# Patient Record
Sex: Male | Born: 1955 | Race: Black or African American | Hispanic: No | Marital: Single | Smoking: Current every day smoker
Health system: Southern US, Community
[De-identification: ages and names within clinical notes are randomized; demographics above are authoritative.]

## PROBLEM LIST (undated history)

## (undated) DIAGNOSIS — B1920 Unspecified viral hepatitis C without hepatic coma: Secondary | ICD-10-CM

## (undated) DIAGNOSIS — F209 Schizophrenia, unspecified: Secondary | ICD-10-CM

## (undated) HISTORY — DX: Unspecified viral hepatitis C without hepatic coma: B19.20

---

## 2018-11-28 ENCOUNTER — Other Ambulatory Visit: Payer: Self-pay

## 2018-11-28 ENCOUNTER — Encounter: Payer: Self-pay | Admitting: Emergency Medicine

## 2018-11-28 ENCOUNTER — Emergency Department
Admission: EM | Admit: 2018-11-28 | Discharge: 2018-11-30 | Disposition: A | Discharge status: Other Acute Inpatient Hospital | Payer: Medicaid Other | Attending: Emergency Medicine | Admitting: Emergency Medicine

## 2018-11-28 DIAGNOSIS — F17200 Nicotine dependence, unspecified, uncomplicated: Secondary | ICD-10-CM | POA: Diagnosis not present

## 2018-11-28 DIAGNOSIS — F203 Undifferentiated schizophrenia: Secondary | ICD-10-CM

## 2018-11-28 DIAGNOSIS — R419 Unspecified symptoms and signs involving cognitive functions and awareness: Secondary | ICD-10-CM

## 2018-11-28 DIAGNOSIS — F209 Schizophrenia, unspecified: Secondary | ICD-10-CM | POA: Diagnosis not present

## 2018-11-28 DIAGNOSIS — Z79899 Other long term (current) drug therapy: Secondary | ICD-10-CM | POA: Diagnosis not present

## 2018-11-28 DIAGNOSIS — F29 Unspecified psychosis not due to a substance or known physiological condition: Secondary | ICD-10-CM | POA: Diagnosis present

## 2018-11-28 DIAGNOSIS — R4689 Other symptoms and signs involving appearance and behavior: Secondary | ICD-10-CM

## 2018-11-28 HISTORY — DX: Schizophrenia, unspecified: F20.9

## 2018-11-28 LAB — COMPREHENSIVE METABOLIC PANEL
ALT: 32 U/L (ref 0–44)
AST: 54 U/L — ABNORMAL HIGH (ref 15–41)
Albumin: 3.9 g/dL (ref 3.5–5.0)
Alkaline Phosphatase: 66 U/L (ref 38–126)
Anion gap: 8 (ref 5–15)
BUN: 19 mg/dL (ref 8–23)
CALCIUM: 9.3 mg/dL (ref 8.9–10.3)
CO2: 24 mmol/L (ref 22–32)
Chloride: 112 mmol/L — ABNORMAL HIGH (ref 98–111)
Creatinine, Ser: 0.87 mg/dL (ref 0.61–1.24)
GFR calc Af Amer: >60 mL/min (ref >60)
GFR calc non Af Amer: >60 mL/min (ref >60)
Glucose, Bld: 81 mg/dL (ref 70–99)
Potassium: 4 mmol/L (ref 3.5–5.1)
Sodium: 144 mmol/L (ref 135–145)
Total Bilirubin: 1.3 mg/dL — ABNORMAL HIGH (ref 0.3–1.2)
Total Protein: 8 g/dL (ref 6.5–8.1)

## 2018-11-28 LAB — CBC
HEMATOCRIT: 45.9 % (ref 39.0–52.0)
Hemoglobin: 15.2 g/dL (ref 13.0–17.0)
MCH: 32.1 pg (ref 26.0–34.0)
MCHC: 33.1 g/dL (ref 30.0–36.0)
MCV: 97 fL (ref 80.0–100.0)
PLATELETS: 248 10*3/uL (ref 150–400)
RBC: 4.73 MIL/uL (ref 4.22–5.81)
RDW: 11.8 % (ref 11.5–15.5)
WBC: 7.3 10*3/uL (ref 4.0–10.5)
nRBC: 0 % (ref 0.0–0.2)

## 2018-11-28 LAB — ETHANOL: Alcohol, Ethyl (B): <10 mg/dL (ref <10)

## 2018-11-28 NOTE — ED Triage Notes (Signed)
Pt arrives POV to triage with [facility] with c/o aggression towards staff members at [facility] in [place]. Pt is calm and cooperative at this time in triage and reports that he was upset because "they were messing with those children". Pt is in NAD.

## 2018-11-28 NOTE — ED Notes (Signed)
Pt has one pair of muddy nike shoes, one pair of polka dotted socks, one brown jacket, one blue sweater, one pair blue jeans, one green belt, and one pair plaid underwear. Pt also placed a comb and four dollars in bag.

## 2018-11-28 NOTE — ED Notes (Signed)
When asked about urine pt states that "I don't have to wet wet at this time".

## 2018-11-29 DIAGNOSIS — F209 Schizophrenia, unspecified: Secondary | ICD-10-CM

## 2018-11-29 DIAGNOSIS — R419 Unspecified symptoms and signs involving cognitive functions and awareness: Secondary | ICD-10-CM

## 2018-11-29 DIAGNOSIS — F203 Undifferentiated schizophrenia: Secondary | ICD-10-CM

## 2018-11-29 LAB — URINE DRUG SCREEN, QUALITATIVE (ARMC ONLY)
Amphetamines, Ur Screen: NOT DETECTED
BARBITURATES, UR SCREEN: NOT DETECTED
Benzodiazepine, Ur Scrn: NOT DETECTED
Cannabinoid 50 Ng, Ur ~~LOC~~: NOT DETECTED
Cocaine Metabolite,Ur ~~LOC~~: NOT DETECTED
MDMA (Ecstasy)Ur Screen: NOT DETECTED
METHADONE SCREEN, URINE: NOT DETECTED
Opiate, Ur Screen: NOT DETECTED
Phencyclidine (PCP) Ur S: NOT DETECTED
Tricyclic, Ur Screen: NOT DETECTED

## 2018-11-29 MED ORDER — LORAZEPAM 2 MG PO TABS
2.0000 mg | ORAL_TABLET | Freq: Once | ORAL | Status: AC
  Administered 2018-11-29: 2 mg via ORAL
  Filled 2018-11-29: qty 1

## 2018-11-29 NOTE — ED Notes (Signed)
IVC, pending placement per SOC 

## 2018-11-29 NOTE — ED Notes (Signed)
Pt sleeping. Lunch tray placed at bedside. 

## 2018-11-29 NOTE — BH Assessment (Signed)
Assessment Note  Lance Bautista is an 63 y.o. male. Patient presents to [facility]ED due to aggression towards [facility] in [place], [place]. Patient was a poor historian during assessment and states "I don't know why I'm here." Patient wasn't able to identify where he lives and if he has a legal guardian. Patient denies SI/HI/AVH.  Patient denies previous inpatient psychiatric hospitalizations and outpatient mental health providers.   Patient denies ETOH and illicit drug use.  Patient doesn't currently have any involvement in the legal system.  Patient presents oriented x 2, with a pleasant affect during assessment.    Diagnosis: Schizophrenia  Past Medical History:  Past Medical History:  Diagnosis Date  . Schizophrenia (HCC)     History reviewed. No pertinent surgical history.  Family History: No family history on file.  Social History:  reports that he has been smoking. He has never used smokeless tobacco. He reports previous alcohol use. He reports previous drug use.  Additional Social History:  Alcohol / Drug Use Pain Medications: SEE PTA  Prescriptions: SEE PTA  Over the Counter: SEE PTA  History of alcohol / drug use?: No history of alcohol / drug abuse Longest period of sobriety (when/how long): None reported   CIWA: CIWA-Ar BP: 140/83 Pulse Rate: 83 COWS:    Allergies: No Known Allergies  Home Medications: (Not in a hospital admission)   OB/GYN Status:  No LMP for male patient.  General Assessment Data Assessment unable to be completed: (Assessment completed) [facility] T[facility] Assessment: In system Is this a Tele or Face-to-Face Assessment?: Face-to-Face Is this an Initial Assessment or a Re-assessment for this encounter?: Initial Assessment Patient Accompanied by:: N/A Language Other than English: No Living Arrangements: In Group Home: (Comment: Name of Group Home)([facility] ([place], [place]) ) What gender do you  identify as?: Male Marital status: Single Maiden name: N/A Pregnancy Status: No Living Arrangements: Group Home([facility] ([place], [place]) ) Can pt return to current living arrangement?: Yes Admission Status: Involuntary Petitioner: Police Is patient capable of signing voluntary admission?: No Referral Source: Self/Family/Friend Insurance type: Medicaid   Medical Screening Exam [place]([facility] Walk-in ONLY) Medical Exam completed: Yes  Crisis Care Plan Living Arrangements: Group Home([facility] ([place], [place]) ) Legal Guardian: Other:(Unknown) Name of Psychiatrist: Unknown Name of Therapist: Unknown  Education Status Is patient currently in school?: No Is the patient employed, unemployed or receiving disability?: Unemployed  Risk to self with the past 6 months Suicidal Ideation: No Has patient been a risk to self within the past 6 months prior to admission? : No Suicidal Intent: No Has patient had any suicidal intent within the past 6 months prior to admission? : No Is patient at risk for suicide?: No Suicidal Plan?: No Has patient had any suicidal plan within the past 6 months prior to admission? : No Access to Means: No What has been your use of drugs/alcohol within the last 12 months?: None reported  Previous Attempts/Gestures: No How many times?: 0 Other Self Harm Risks: None reported  Triggers for Past Attempts: Unknown Intentional Self Injurious Behavior: None Family Suicide History: Unknown Recent stressful life event(s): Other (Comment) Persecutory voices/beliefs?: No Depression: Yes Depression Symptoms: Feeling angry/irritable Substance abuse history and/or treatment for substance abuse?: No Suicide prevention information given to non-admitted patients: Not applicable  Risk to Others within the past 6 months Homicidal Ideation: No Does patient have any lifetime risk of violence toward others beyond the six months prior  to admission? : No Thoughts  of Harm to Others: No Current Homicidal Intent: No Current Homicidal Plan: No Access to Homicidal Means: No Identified Victim: None reported  History of harm to others?: Yes Assessment of Violence: On admission Violent Behavior Description: aggressive behaviors at group home, aggressive criminial hx Does patient have access to weapons?: No Criminal Charges Pending?: No Does patient have a court date: No Is patient on probation?: No  Psychosis Hallucinations: None noted Delusions: None noted  Mental Status Report Appearance/Hygiene: In scrubs Eye Contact: Fair Motor Activity: Unremarkable Speech: Unremarkable Level of Consciousness: Alert Mood: Pleasant Affect: Other (Comment)(pleasant ) Anxiety Level: None Thought Processes: Thought Blocking Judgement: Impaired Orientation: Place, Person, Time Obsessive Compulsive Thoughts/Behaviors: None  Cognitive Functioning Concentration: Normal Memory: Recent Impaired, Remote Intact Is patient IDD: No Insight: Poor Impulse Control: Poor Appetite: Fair Have you had any weight changes? : No Change Sleep: No Change Total Hours of Sleep: 8 Vegetative Symptoms: None  ADLScreening [facility] Assessment Services) Patient's cognitive ability adequate to safely complete daily activities?: Yes Patient able to express need for assistance with ADLs?: Yes Independently performs ADLs?: Yes (appropriate for developmental age)  Prior Inpatient Therapy Prior Inpatient Therapy: No  Prior Outpatient Therapy Prior Outpatient Therapy: No Does patient have an ACCT team?: No Does patient have Intensive In-House Services?  : No Does patient have [facility] services? : No Does patient have P4CC services?: No  ADL Screening (condition at time of admission) Patient's cognitive ability adequate to safely complete daily activities?: Yes Is the patient deaf or have difficulty hearing?: No Does the patient have difficulty seeing, even when wearing  glasses/contacts?: No Does the patient have difficulty concentrating, remembering, or making decisions?: Yes Patient able to express need for assistance with ADLs?: Yes Does the patient have difficulty dressing or bathing?: No Independently performs ADLs?: Yes (appropriate for developmental age) Does the patient have difficulty walking or climbing stairs?: No Weakness of Legs: None Weakness of Arms/Hands: None  Home Assistive Devices/Equipment Home Assistive Devices/Equipment: None  Therapy Consults (therapy consults require a physician order) PT Evaluation Needed: No OT Evalulation Needed: No SLP Evaluation Needed: No Abuse/Neglect Assessment (Assessment to be complete while patient is alone) Abuse/Neglect Assessment Can Be Completed: Yes Physical Abuse: Denies Verbal Abuse: Denies Sexual Abuse: Denies Exploitation of patient/patient's resources: Denies Self-Neglect: Denies Values / Beliefs Cultural Requests During Hospitalization: None Spiritual Requests During Hospitalization: None Consults Spiritual Care Consult Needed: No Social Work Consult Needed: No Merchant navy officerAdvance Directives (For Healthcare) Does Patient Have a Medical Advance Directive?: No Would patient like information on creating a medical advance directive?: No - Patient declined          Disposition:  Disposition Initial Assessment Completed for this Encounter: Yes Patient referred to: Other (Comment)(pending psych consult )  On Site Evaluation by:   Reviewed with Physician:    Galen ManilaFEDORIA L Kimetha Trulson, LPC, LCASA 11/29/2018 2:02 AM

## 2018-11-29 NOTE — ED Provider Notes (Signed)
[facility] Emergency Department Provider Note ____________________________________________   First MD Initiated Contact with Patient 11/28/18 2350     (approximate)  I have reviewed the triage vital signs and the nursing notes.   HISTORY  Chief Complaint Aggressive Behavior  Level 5 caveat: History of present illness limited due to disorganized historian  HPI Lance Bautista is a 63 y.o. male with PMH as noted below who presents with concern for aggressive behavior toward staff at his group home.  The patient denies any acute complaints to me at this time and states "I wasn't trying to bother nobody."  He denies SI or HI.  He denies any acute medical complaints.   Past Medical History:  Diagnosis Date  . Schizophrenia (HCC)     There are no active problems to display for this patient.   History reviewed. No pertinent surgical history.  Prior to Admission medications   Medication Sig Start Date End Date Taking? Authorizing Provider  chlorhexidine (PERIDEX) 0.12 % solution Use as directed 15 mLs in the mouth or throat 2 (two) times daily.   Yes [provider]  OLANZapine (ZYPREXA) 5 MG tablet Take 5 mg by mouth at bedtime.   Yes [provider]    Allergies Patient has no known allergies.  No family history on file.  Social History Social History   Tobacco Use  . Smoking status: Current Every Day Smoker  . Smokeless tobacco: Never Used  Substance Use Topics  . Alcohol use: Not Currently  . Drug use: Not Currently    Review of Systems Level 5 caveat: Review of systems limited due to disorganized historian Constitutional: No fever. Cardiovascular: Denies chest pain. Gastrointestinal: No vomiting.  Musculoskeletal: Negative for back pain. Neurological: Negative for headache.   ____________________________________________   PHYSICAL EXAM:  VITAL SIGNS: ED Triage Vitals [11/28/18 2208]  Enc Vitals Group     BP  140/83     Pulse Rate 83     Resp 18     Temp 98.4 F (36.9 C)     Temp Source Oral     SpO2 97 %     Weight 205 lb (93 kg)     Height 5\' 10"  (1.778 m)     Head Circumference      Peak Flow      Pain Score 0     Pain Loc      Pain Edu?      Excl. in GC?     Constitutional: Alert and oriented. Well appearing and in no acute distress. Eyes: Conjunctivae are normal.  Head: Atraumatic. Nose: No congestion/rhinnorhea. Mouth/Throat: Mucous membranes are moist.   Neck: Normal range of motion.  Cardiovascular: Good peripheral circulation. Respiratory: Normal respiratory effort.   Gastrointestinal:  No distention.  Musculoskeletal:  Extremities warm and well perfused.  Neurologic:  Normal speech and language. No gross focal neurologic deficits are appreciated.  Skin:  Skin is warm and dry. No rash noted. Psychiatric: Calm and cooperative.  ____________________________________________   LABS (all labs ordered are listed, but only abnormal results are displayed)  Labs Reviewed  COMPREHENSIVE METABOLIC PANEL - Abnormal; Notable for the following components:      Result Value   Chloride 112 (*)    AST 54 (*)    Total Bilirubin 1.3 (*)    All other components within normal limits  ETHANOL  CBC  URINE DRUG SCREEN, QUALITATIVE ([facility] ONLY)   ____________________________________________  EKG   ____________________________________________  RADIOLOGY  ____________________________________________   PROCEDURES  Procedure(s) performed: No  Procedures  Critical Care performed: No ____________________________________________   INITIAL IMPRESSION / ASSESSMENT AND PLAN / ED COURSE  Pertinent labs & imaging results that were available during my care of the patient were reviewed by me and considered in my medical decision making (see chart for details).  63 year old male with a history of schizophrenia presents from his group home with apparent aggressive behavior.   The patient denies any acute complaints at this time.  He is calm and cooperative in the ED.  His vital signs are normal.  The remainder of the exam is unremarkable.  Lab work-up obtained for medical clearance is within normal limits.  Given that the patient is somewhat disorganized I do not think he would do well with [facility] tele-psychiatry consultation.  I will have TTS evaluate him and plan for in person psychiatry evaluation in the morning.  ----------------------------------------- 7:18 AM on 11/29/2018 -----------------------------------------  Patient is medically cleared and pending psychiatry evaluation.  I am signing the patient out to the oncoming physician Dr. Sharma Covert.   ____________________________________________   FINAL CLINICAL IMPRESSION(S) / ED DIAGNOSES  Final diagnoses:  Behavior concern      NEW MEDICATIONS STARTED DURING THIS VISIT:  New Prescriptions   No medications on file     Note:  This document was prepared using Dragon voice recognition software and may include unintentional dictation errors.    Dionne Bucy, MD 11/29/18 [phone]

## 2018-11-29 NOTE — ED Notes (Signed)
Pt observed responding to internal sitmuli and talking to self in his room. Awaiting SOC/psych consult at this time.

## 2018-11-29 NOTE — ED Notes (Signed)
Per [facility], pt to be inpatient admission. He also states that he would prefer pt be given Haldol if he gets aggressive or has an outburst in future.

## 2018-11-29 NOTE — ED Provider Notes (Signed)
-----------------------------------------   11:19 PM on 11/29/2018 -----------------------------------------   Blood pressure (!) 140/95, pulse 83, temperature 97.7 F (36.5 C), temperature source Oral, resp. rate 20, height 1.778 m (5\' 10" ), weight 93 kg, SpO2 96 %.  The patient is calm and cooperative at this time.  Admission orders have been placed for the patient to be taken downstairs to behavioral medicine.  This occurred before 5:00 PM.  As of 11:20 PM the patient still is in the emergency department and has not yet been brought downstairs.    Loleta Rose, MD 11/29/18 [phone]

## 2018-11-29 NOTE — ED Notes (Signed)
Pt denies SI/HI/AVH on assessment. Able to express needs and follow staff directions. Pleasant during interaction. Observed to be responding to internal stimuli talking loudly to self in room. No current meds. Will continue to monitor.

## 2018-11-29 NOTE — BH Assessment (Signed)
Per [facility], patient meets inpatient criteria.  Spoke with Psych MD (Dr. Leighton Roach) and he seen patient and put in admission orders.  Writer spoke with patient's guardian, [facility] ([phone]) and he provided verbal consent for the patient to be admitted as well as any medication changes. Writer informed Psych MD (Dr. Leighton Roach) of the conversation with patient's guardian.

## 2018-11-29 NOTE — ED Notes (Signed)
Pt given breakfast tray
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
## 2018-11-29 NOTE — ED Notes (Signed)
IVC, pending SOC 

## 2018-11-29 NOTE — ED Notes (Signed)
Pt talking loudly in room stating they will kill someone. After several minute pt opened his door and did not have on any clothing. Pt looked at me and got back in bed and covered up. Pt is still talking to someone.

## 2018-11-30 ENCOUNTER — Encounter: Payer: Self-pay | Admitting: *Deleted

## 2018-11-30 ENCOUNTER — Inpatient Hospital Stay
Admission: AD | Admit: 2018-11-30 | Discharge: 2018-12-02 | DRG: 885 | Disposition: A | Discharge status: Home / Self Care | Payer: Medicaid Other | Attending: Psychiatry | Admitting: Psychiatry

## 2018-11-30 ENCOUNTER — Other Ambulatory Visit: Payer: Self-pay

## 2018-11-30 DIAGNOSIS — R9431 Abnormal electrocardiogram [ECG] [EKG]: Secondary | ICD-10-CM | POA: Diagnosis present

## 2018-11-30 DIAGNOSIS — Z5181 Encounter for therapeutic drug level monitoring: Secondary | ICD-10-CM | POA: Diagnosis not present

## 2018-11-30 DIAGNOSIS — R419 Unspecified symptoms and signs involving cognitive functions and awareness: Secondary | ICD-10-CM | POA: Diagnosis present

## 2018-11-30 DIAGNOSIS — F17210 Nicotine dependence, cigarettes, uncomplicated: Secondary | ICD-10-CM | POA: Diagnosis present

## 2018-11-30 DIAGNOSIS — K0510 Chronic gingivitis, plaque induced: Secondary | ICD-10-CM | POA: Diagnosis present

## 2018-11-30 DIAGNOSIS — F203 Undifferentiated schizophrenia: Principal | ICD-10-CM | POA: Diagnosis present

## 2018-11-30 DIAGNOSIS — F17200 Nicotine dependence, unspecified, uncomplicated: Secondary | ICD-10-CM | POA: Diagnosis present

## 2018-11-30 MED ORDER — MAGNESIUM HYDROXIDE 400 MG/5ML PO SUSP: 30.0000 mL | Freq: Every day | ORAL | Status: DC | PRN

## 2018-11-30 MED ORDER — NICOTINE 21 MG/24HR TD PT24
21.0000 mg | MEDICATED_PATCH | Freq: Every day | TRANSDERMAL | Status: DC
  Administered 2018-12-01: 21 mg via TRANSDERMAL
  Filled 2018-11-30 (×2): qty 1

## 2018-11-30 MED ORDER — TRAZODONE HCL 50 MG PO TABS
50.0000 mg | ORAL_TABLET | Freq: Every evening | ORAL | Status: DC | PRN
  Filled 2018-11-30: qty 1

## 2018-11-30 MED ORDER — CHLORHEXIDINE GLUCONATE 0.12 % MT SOLN
15.0000 mL | Freq: Two times a day (BID) | OROMUCOSAL | Status: DC
  Administered 2018-11-30 – 2018-12-01 (×3): 15 mL via OROMUCOSAL
  Filled 2018-11-30 (×6): qty 15

## 2018-11-30 MED ORDER — OLANZAPINE 5 MG PO TABS
5.0000 mg | ORAL_TABLET | Freq: Every day | ORAL | Status: DC
  Administered 2018-11-30 – 2018-12-01 (×2): 5 mg via ORAL
  Filled 2018-11-30 (×2): qty 1

## 2018-11-30 MED ORDER — ACETAMINOPHEN 325 MG PO TABS: 650.0000 mg | ORAL_TABLET | Freq: Four times a day (QID) | ORAL | Status: DC | PRN

## 2018-11-30 MED ORDER — ALUM & MAG HYDROXIDE-SIMETH 200-200-20 MG/5ML PO SUSP: 30.0000 mL | ORAL | Status: DC | PRN

## 2018-11-30 NOTE — ED Notes (Signed)
Pt discharged under IVC to BMU. Report called to [place], Charity fundraiserN.  VS stable. Belongings will be sent with patient.

## 2018-11-30 NOTE — BH Assessment (Signed)
Patient is to be admitted to [facility] by Dr. Viviano Simas.  Attending Physician will be Dr. Jennet Maduro.   Patient has been assigned to room 311-B, by [facility] Charge Nurse Lillette Boxer   ER staff is aware of the admission:  Dr. Lenard Lance, ER MD   Amy B., Patient's Nurse   Ivin Booty, Patient Access.

## 2018-11-30 NOTE — ED Notes (Signed)
Report given to [place], RN in [place].  RN spoke with TTS. Pt will be reviewed prior to pending transfer.
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
## 2018-11-30 NOTE — BHH Group Notes (Signed)
LCSW Group Therapy Note   11/30/2018 1:00 PM  Type of Therapy and Topic:  Group Therapy:  Overcoming Obstacles   Participation Level:  Minimal   Description of Group:    In this group patients will be encouraged to explore what they see as obstacles to their own wellness and recovery. They will be guided to discuss their thoughts, feelings, and behaviors related to these obstacles. The group will process together ways to cope with barriers, with attention given to specific choices patients can make. Each patient will be challenged to identify changes they are motivated to make in order to overcome their obstacles. This group will be process-oriented, with patients participating in exploration of their own experiences as well as giving and receiving support and challenge from other group members.   Therapeutic Goals: 1. Patient will identify personal and current obstacles as they relate to admission. 2. Patient will identify barriers that currently interfere with their wellness or overcoming obstacles.  3. Patient will identify feelings, thought process and behaviors related to these barriers. 4. Patient will identify two changes they are willing to make to overcome these obstacles:      Summary of Patient Progress Pt arrived to group late and left early.  Patient was able to identify obstacle in his life, however, obstacles were not MH or SA related and were off topic.  CSW attempted to assess for any barriers in these areas and the patient denied.  Patient left group before exploration of how to cope with these changes.   Therapeutic Modalities:   Cognitive Behavioral Therapy Solution Focused Therapy Motivational Interviewing Relapse Prevention Therapy  Penni Homans, MSW, LCSW 11/30/2018 3:52 PM

## 2018-11-30 NOTE — BHH Suicide Risk Assessment (Signed)
[facility] Admission Suicide Risk Assessment   Nursing information obtained from:  (P) Patient Demographic factors:  (P) Male, Low socioeconomic status, Unemployed Current Mental Status:  (P) Intention to act on plan to harm others Loss Factors:    Historical Factors:    Risk Reduction Factors:     Total Time spent with patient: 1 hour Principal Problem: Undifferentiated schizophrenia (HCC) Diagnosis:  Principal Problem:   Undifferentiated schizophrenia (HCC) Active Problems:   Neurocognitive disorder   Tobacco use disorder  Subjective Data: aggressive behavior  Continued Clinical Symptoms:  Alcohol Use Disorder Identification Test Final Score (AUDIT): 6 The "Alcohol Use Disorders Identification Test", Guidelines for Use in Primary Care, Second Edition.  World Science writer [facility]). Score between 0-7:  no or low risk or alcohol related problems. Score between 8-15:  moderate risk of alcohol related problems. Score between 16-19:  high risk of alcohol related problems. Score 20 or above:  warrants further diagnostic evaluation for alcohol dependence and treatment.   CLINICAL FACTORS:   Schizophrenia:   Paranoid or undifferentiated type Currently Psychotic   Musculoskeletal: Strength & Muscle Tone: within normal limits Gait & Station: normal Patient leans: N/A  Psychiatric Specialty Exam: Physical Exam  Nursing note and vitals reviewed. Psychiatric: His affect is inappropriate. His speech is delayed and tangential. He is actively hallucinating. Thought content is paranoid and delusional. Cognition and memory are impaired. He expresses inappropriate judgment. He exhibits abnormal recent memory and abnormal remote memory.    Review of Systems  Unable to perform ROS: Mental status change  All other systems reviewed and are negative.   Blood pressure (!) 137/111, pulse 90, temperature 98.2 F (36.8 C), temperature source Oral, resp. rate 18, height 5\' 7"  (1.702 m), weight 84.4  kg, SpO2 98 %.Body mass index is 29.13 kg/m.  General Appearance: Disheveled  Eye Contact:  Minimal  Speech:  Slow  Volume:  Decreased  Mood:  Irritable  Affect:  Congruent  Thought Process:  Irrelevant  Orientation:  Other:  person only  Thought Content:  Delusions, Hallucinations: Auditory and Paranoid Ideation  Suicidal Thoughts:  No  Homicidal Thoughts:  No  Memory:  Immediate;   Poor Recent;   Poor Remote;   Poor  Judgement:  Poor  Insight:  Lacking  Psychomotor Activity:  Increased  Concentration:  Concentration: Poor and Attention Span: Poor  Recall:  Poor  Fund of Knowledge:  Poor  Language:  Poor  Akathisia:  No  Handed:  Right  AIMS (if indicated):     Assets:  Communication Skills Desire for Improvement Financial Resources/Insurance Housing Physical Health Resilience  ADL's:  Impaired  Cognition:  Impaired,  Severe  Sleep:         COGNITIVE FEATURES THAT CONTRIBUTE TO RISK:  Loss of executive function    SUICIDE RISK:   Minimal: No identifiable suicidal ideation.  Patients presenting with no risk factors but with morbid ruminations; may be classified as minimal risk based on the severity of the depressive symptoms  PLAN OF CARE: hospital admission, medication management, discharge planning.  Lance Bautista is a 63 year old male with a history of schizophrenia and cognitive decline who is hallucinating at baseline admitted foer ne aggressive behavior towards his peers at the nursing facility.  #Psychosis -continue Zyprexa 5 mg nightly  #Labs -lipid panel, TSH, A1C -EKG  #Smoking cessation -nicotine patch is available  #Social -incompetent adult  #Disposition -discharge back to his facility -follow up with his regular provider   I certify that  inpatient services furnished can reasonably be expected to improve the patient's condition.   Kristine Linea, MD 11/30/2018, 12:39 PM

## 2018-11-30 NOTE — ED Notes (Signed)
Pt given breakfast.

## 2018-11-30 NOTE — ED Notes (Signed)
Patient resting quietly in room. No noted distress or abnormal behaviors noted. Will continue 15 minute checks and observation by security for safety. 

## 2018-11-30 NOTE — H&P (Signed)
Psychiatric Admission Assessment Adult  Patient Identification: Lance Bautista MRN:  [id number] Date of Evaluation:  11/30/2018 Chief Complaint:  schizophrenia Principal Diagnosis: Undifferentiated schizophrenia (HCC) Diagnosis:  Principal Problem:   Undifferentiated schizophrenia (HCC) Active Problems:   Neurocognitive disorder   Tobacco use disorder  History of Present Illness:   Identifying data. [name] is a 63 year old male with history of schizophrenia.  Chief complaint. "You need to understand my family history."  History of present illness. Information was obtained from the patient, the chart and from his guardian. The patient was brought to the ER fro [facility] for agitation and threatening his peers. He was upset about "children". He has been at this facility for over a year. Even though he is psychotic at baseline and shows signs of cognitive decline, he has not been aggressive or agitated. Per his guardian. He has been prescribed 5 mg of Zyprexa all along.   On direct questioning, he denies any symptoms of depression, anxiety or psychosis. There are no somatic complaints. Sleep and appetite are good. He believes tah moths wash is the only medication he has ben taking.  The patient knows he lives in a nursing home and wants to return there "as soon as possible to be nice to everybody". He does not remember threatening or agitation and wants to share his family history. He has several brothers, a Production assistant, radiositer and parent who "disrespected him" all his life.  Past psychiatric history. Long history of mental illness with many incarcerations. According to the patient, he was in prison for 42 years. After he was [place] the woods, he was [facility] over one [facility].   Family psychiatric history. Reports none.  Social history. Disabled from mental illness. He is incompetent adult. Lives in a family care home.  Total Time spent with patient: 1 hour  Is  the patient at risk to self? No.  Has the patient been a risk to self in the past 6 months? No.  Has the patient been a risk to self within the distant past? No.  Is the patient a risk to others? Yes.    Has the patient been a risk to others in the past 6 months? No.  Has the patient been a risk to others within the distant past? No.   Prior Inpatient Therapy:   Prior Outpatient Therapy:    Alcohol Screening: 1. How often do you have a drink containing alcohol?: 2 to 4 times a month 2. How many drinks containing alcohol do you have on a typical day when you are drinking?: 5 or 6 3. How often do you have six or more drinks on one occasion?: Monthly AUDIT-C Score: 6 4. How often during the last year have you found that you were not able to stop drinking once you had started?: Never 5. How often during the last year have you failed to do what was normally expected from you becasue of drinking?: Never 6. How often during the last year have you needed a first drink in the morning to get yourself going after a heavy drinking session?: Never 7. How often during the last year have you had a feeling of guilt of remorse after drinking?: Never 8. How often during the last year have you been unable to remember what happened the night before because you had been drinking?: Never 9. Have you or someone else been injured as a result of your drinking?: No 10. Has a relative or  friend or a doctor or another health worker been concerned about your drinking or suggested you cut down?: No Alcohol Use Disorder Identification Test Final Score (AUDIT): 6 Intervention/Follow-up: AUDIT Score <7 follow-up not indicated Substance Abuse History in the last 12 months:  No. Consequences of Substance Abuse: NA Previous Psychotropic Medications: Yes  Psychological Evaluations: No  Past Medical History:  Past Medical History:  Diagnosis Date  . Schizophrenia (HCC)    History reviewed. No pertinent surgical  history. Family History: History reviewed. No pertinent family history.  Tobacco Screening:   Social History:  Social History   Substance and Sexual Activity  Alcohol Use Not Currently     Social History   Substance and Sexual Activity  Drug Use Not Currently    Additional Social History:                           Allergies:  No Known Allergies Lab Results:  Results for orders placed or performed during the hospital encounter of 11/28/18 (from the past 48 hour(s))  Comprehensive metabolic panel     Status: Abnormal   Collection Time: 11/28/18 10:10 PM  Result Value Ref Range   Sodium 144 135 - 145 mmol/L   Potassium 4.0 3.5 - 5.1 mmol/L   Chloride 112 (H) 98 - 111 mmol/L   CO2 24 22 - 32 mmol/L   Glucose, Bld 81 70 - 99 mg/dL   BUN 19 8 - 23 mg/dL   Creatinine, Ser 1.61 0.61 - 1.24 mg/dL   Calcium 9.3 8.9 - 09.6 mg/dL   Total Protein 8.0 6.5 - 8.1 g/dL   Albumin 3.9 3.5 - 5.0 g/dL   AST 54 (H) 15 - 41 U/L   ALT 32 0 - 44 U/L   Alkaline Phosphatase 66 38 - 126 U/L   Total Bilirubin 1.3 (H) 0.3 - 1.2 mg/dL   GFR calc non Af Amer >60 >60 mL/min   GFR calc Af Amer >60 >60 mL/min   Anion gap 8 5 - 15    Comment: Performed at [facility], [address] [place], [place] [address]  Ethanol     Status: None   Collection Time: 11/28/18 10:10 PM  Result Value Ref Range   Alcohol, Ethyl (B) <10 <10 mg/dL    Comment: (NOTE) Lowest detectable limit for serum alcohol is 10 mg/dL. For medical purposes only. Performed at [facility], [address] [place], [place] [address]   cbc     Status: None   Collection Time: 11/28/18 10:10 PM  Result Value Ref Range   WBC 7.3 4.0 - 10.5 K/uL   RBC 4.73 4.22 - 5.81 MIL/uL   Hemoglobin 15.2 13.0 - 17.0 g/dL   HCT 14.7 82.9 - 56.2 %   MCV 97.0 80.0 - 100.0 fL   MCH 32.1 26.0 - 34.0 pg   MCHC 33.1 30.0 - 36.0 g/dL   RDW 13.0 86.5 - 78.4 %   Platelets 248 150 - 400 K/uL   nRBC 0.0 0.0 - 0.2 %     Comment: Performed at [facility], [address], [place], [place] [address]  Urine Drug Screen, Qualitative     Status: None   Collection Time: 11/29/18  8:30 AM  Result Value Ref Range   Tricyclic, Ur Screen NONE DETECTED NONE DETECTED   Amphetamines, Ur Screen NONE DETECTED NONE DETECTED   MDMA (Ecstasy)Ur Screen NONE DETECTED NONE DETECTED  Cocaine Metabolite,Ur [place] NONE DETECTED NONE DETECTED   Opiate, Ur Screen NONE DETECTED NONE DETECTED   Phencyclidine (PCP) Ur S NONE DETECTED NONE DETECTED   Cannabinoid 50 Ng, Ur [place] NONE DETECTED NONE DETECTED   Barbiturates, Ur Screen NONE DETECTED NONE DETECTED   Benzodiazepine, Ur Scrn NONE DETECTED NONE DETECTED   Methadone Scn, Ur NONE DETECTED NONE DETECTED    Comment: (NOTE) Tricyclics + metabolites, urine    Cutoff 1000 ng/mL Amphetamines + metabolites, urine  Cutoff 1000 ng/mL MDMA (Ecstasy), urine              Cutoff 500 ng/mL Cocaine Metabolite, urine          Cutoff 300 ng/mL Opiate + metabolites, urine        Cutoff 300 ng/mL Phencyclidine (PCP), urine         Cutoff 25 ng/mL Cannabinoid, urine                 Cutoff 50 ng/mL Barbiturates + metabolites, urine  Cutoff 200 ng/mL Benzodiazepine, urine              Cutoff 200 ng/mL Methadone, urine                   Cutoff 300 ng/mL The urine drug screen provides only a preliminary, unconfirmed analytical test result and should not be used for non-medical purposes. Clinical consideration and professional judgment should be applied to any positive drug screen result due to possible interfering substances. A more specific alternate chemical method must be used in order to obtain a confirmed analytical result. Gas chromatography / mass spectrometry (GC/MS) is the preferred confirmat ory method. Performed at [facility], [address] [place], [place] [address]     Blood Alcohol level:  Lab Results  Component Value Date   [facility] <10 11/28/2018     Metabolic Disorder Labs:  No results found for: HGBA1C, MPG No results found for: PROLACTIN No results found for: CHOL, TRIG, HDL, CHOLHDL, VLDL, LDLCALC  Current Medications: Current Facility-Administered Medications  Medication Dose Route Frequency Provider Last Rate Last Dose  . acetaminophen (TYLENOL) tablet 650 mg  650 mg Oral Q6H PRN Santo Held, MD      . alum & mag hydroxide-simeth (MAALOX/MYLANTA) 200-200-20 MG/5ML suspension 30 mL  30 mL Oral Q4H PRN Santo Held, MD      . chlorhexidine (PERIDEX) 0.12 % solution 15 mL  15 mL Mouth/Throat BID Santo Held, MD      . magnesium hydroxide (MILK OF MAGNESIA) suspension 30 mL  30 mL Oral Daily PRN Santo Held, MD      . OLANZapine (ZYPREXA) tablet 5 mg  5 mg Oral QHS Santo Held, MD      . traZODone (DESYREL) tablet 50 mg  50 mg Oral QHS PRN Santo Held, MD       PTA Medications: Medications Prior to Admission  Medication Sig Dispense Refill Last Dose  . chlorhexidine (PERIDEX) 0.12 % solution Use as directed 15 mLs in the mouth or throat 2 (two) times daily.   Past Week at Unknown time  . OLANZapine (ZYPREXA) 5 MG tablet Take 5 mg by mouth at bedtime.   Past Week at Unknown time    Musculoskeletal: Strength & Muscle Tone: within normal limits Gait & Station: normal Patient leans: N/A  Psychiatric Specialty Exam: Physical Exam  Nursing note and vitals reviewed. Constitutional: He appears well-developed and well-nourished.  HENT:  Head: Normocephalic and atraumatic.  Eyes:  Pupils are equal, round, and reactive to light. Conjunctivae and EOM are normal.  Neck: Normal range of motion. Neck supple.  Cardiovascular: Normal rate and regular rhythm.  Respiratory: Effort normal and breath sounds normal.  GI: Soft.  Musculoskeletal: Normal range of motion.  Neurological: He is alert.  Skin: Skin is warm and dry.  Psychiatric: His affect is blunt and inappropriate. His speech is delayed and tangential. He is  actively hallucinating. Thought content is paranoid and delusional. Cognition and memory are impaired. He expresses impulsivity. He exhibits abnormal recent memory and abnormal remote memory.    Review of Systems  Unable to perform ROS: Mental acuity  All other systems reviewed and are negative.   Blood pressure (!) 137/111, pulse 90, temperature 98.2 F (36.8 C), temperature source Oral, resp. rate 18, height 5\' 7"  (1.702 m), weight 84.4 kg, SpO2 98 %.Body mass index is 29.13 kg/m.  See SRA                                                  Sleep:       Treatment Plan Summary: Daily contact with patient to assess and evaluate symptoms and progress in treatment and Medication management   [name] is a 63 year old male with a history of schizophrenia and cognitive decline who is hallucinating at baseline admitted foer ne aggressive behavior towards his peers at the nursing facility.  #Psychosis -continue Zyprexa 5 mg nightly  #Labs -lipid panel, TSH, A1C -EKG  #Smoking cessation -nicotine patch is available  #Social -incompetent adult  #Disposition -discharge back to his facility -follow up with his regular provider   Observation Level/Precautions:  15 minute checks  Laboratory:  CBC Chemistry Profile UDS UA  Psychotherapy:    Medications:    Consultations:    Discharge Concerns:    Estimated LOS:  Other:     Physician Treatment Plan for Primary Diagnosis: Undifferentiated schizophrenia (HCC) Long Term Goal(s): Improvement in symptoms so as ready for discharge  Short Term Goals: Ability to identify changes in lifestyle to reduce recurrence of condition will improve, Ability to verbalize feelings will improve, Ability to disclose and discuss suicidal ideas, Ability to demonstrate self-control will improve, Ability to identify and develop effective coping behaviors will improve, Ability to maintain clinical measurements within normal  limits will improve, Compliance with prescribed medications will improve and Ability to identify triggers associated with substance abuse/mental health issues will improve  Physician Treatment Plan for Secondary Diagnosis: Principal Problem:   Undifferentiated schizophrenia (HCC) Active Problems:   Neurocognitive disorder   Tobacco use disorder  Long Term Goal(s): Improvement in symptoms so as ready for discharge  Short Term Goals: NA  I certify that inpatient services furnished can reasonably be expected to improve the patient's condition.    Kristine LineaJolanta Bettyjane Shenoy, MD 1/13/202012:47 PM

## 2018-11-30 NOTE — ED Notes (Signed)
toileting offered, pt given drink and snack

## 2018-11-30 NOTE — [facility] Group Notes (Signed)
[facility] Group Notes:  (Nursing/MHT/Case Management/Adjunct)  Date:  11/30/2018  Time:  10:00 PM  Type of Therapy:  Group Therapy  Participation Level:  Did Not Attend  Mayra Neer 11/30/2018, 10:00 PM

## 2018-12-01 LAB — LIPID PANEL
Cholesterol: 123 mg/dL (ref 0–200)
HDL: 40 mg/dL — ABNORMAL LOW (ref >40)
LDL CALC: 25 mg/dL (ref 0–99)
Total CHOL/HDL Ratio: 3.1 RATIO
Triglycerides: 291 mg/dL — ABNORMAL HIGH (ref <150)
VLDL: 58 mg/dL — ABNORMAL HIGH (ref 0–40)

## 2018-12-01 LAB — TSH: TSH: 0.675 u[IU]/mL (ref 0.350–4.500)

## 2018-12-01 LAB — HEMOGLOBIN A1C
Hgb A1c MFr Bld: 4.6 % — ABNORMAL LOW (ref 4.8–5.6)
Mean Plasma Glucose: 85.32 mg/dL

## 2018-12-01 MED ORDER — TRAZODONE HCL 50 MG PO TABS: 50.0000 mg | ORAL_TABLET | Freq: Every evening | ORAL | 1 refills | Status: DC | PRN

## 2018-12-01 NOTE — [facility] Counselor (Signed)
CSW attempted to complete PSA with the patient however he displayed diorganized thinking.  CSW checked with senior CSW to assess what next steps should be. Once CSW returned the patient declined to complete PSA saying his teeth hurt.  Penni Homans, MSW, LCSW 12/01/2018 3:48 PM

## 2018-12-01 NOTE — Progress Notes (Signed)
[facility]  12/01/2018 4:31 PM Lance Bautista  MRN:  [id number]  Subjective:   [name] is cool and collected. There are no unwanted behaviors. He accepts his Zyprexa and tolerates it well. He denie any symptoms of depression, anxiety or psychosis. He is not suicidal or homicidal. He is delusional, probably at baseline.  Principal Problem: Undifferentiated schizophrenia (HCC) Diagnosis: Principal Problem:   Undifferentiated schizophrenia (HCC) Active Problems:   Neurocognitive disorder   Tobacco use disorder  Total Time spent with patient: 15 minutes  Past Psychiatric History: schizophrenia  Past Medical History:  Past Medical History:  Diagnosis Date  . Schizophrenia (HCC)    History reviewed. No pertinent surgical history. Family History: History reviewed. No pertinent family history. Family Psychiatric  History: none reported Social History:  Social History   Substance and Sexual Activity  Alcohol Use Not Currently     Social History   Substance and Sexual Activity  Drug Use Not Currently    Social History   Socioeconomic History  . Marital status: Single    Spouse name: Not on file  . Number of children: Not on file  . Years of education: Not on file  . Highest education level: Not on file  Occupational History  . Not on file  Social Needs  . Financial resource strain: Not on file  . Food insecurity:    Worry: Not on file    Inability: Not on file  . Transportation needs:    Medical: Not on file    Non-medical: Not on file  Tobacco Use  . Smoking status: Current Every Day Smoker    Packs/day: 1.00    Years: 10.00    Pack years: 10.00  . Smokeless tobacco: Never Used  Substance and Sexual Activity  . Alcohol use: Not Currently  . Drug use: Not Currently  . Sexual activity: Not on file  Lifestyle  . Physical activity:    Days per week: Not on file    Minutes per session: Not on file  . Stress: Not on file  Relationships  . Social  connections:    Talks on phone: Not on file    Gets together: Not on file    Attends religious service: Not on file    Active member of club or organization: Not on file    Attends meetings of clubs or organizations: Not on file    Relationship status: Not on file  Other Topics Concern  . Not on file  Social History Narrative  . Not on file   Additional Social History:                         Sleep: Fair  Appetite:  Fair  Current Medications: Current Facility-Administered Medications  Medication Dose Route Frequency Provider Last Rate Last Dose  . acetaminophen (TYLENOL) tablet 650 mg  650 mg Oral Q6H PRN Santo HeldIqbal, Tanvir, MD      . alum & mag hydroxide-simeth (MAALOX/MYLANTA) 200-200-20 MG/5ML suspension 30 mL  30 mL Oral Q4H PRN Santo HeldIqbal, Tanvir, MD      . chlorhexidine (PERIDEX) 0.12 % solution 15 mL  15 mL Mouth/Throat BID Santo HeldIqbal, Tanvir, MD   15 mL at 12/01/18 0836  . magnesium hydroxide (MILK OF MAGNESIA) suspension 30 mL  30 mL Oral Daily PRN Santo HeldIqbal, Tanvir, MD      . nicotine (NICODERM CQ - dosed in mg/24 hours) patch 21 mg  21 mg Transdermal Daily Jamielee Mchale,  Gwenna Fuston B, MD   21 mg at 12/01/18 0836  . OLANZapine (ZYPREXA) tablet 5 mg  5 mg Oral QHS Santo HeldIqbal, Tanvir, MD   5 mg at 11/30/18 2145  . traZODone (DESYREL) tablet 50 mg  50 mg Oral QHS PRN Santo HeldIqbal, Tanvir, MD        Lab Results: No results found for this or any previous visit (from the past 48 hour(s)).  Blood Alcohol level:  Lab Results  Component Value Date   ETH <10 11/28/2018    Metabolic Disorder Labs: No results found for: HGBA1C, MPG No results found for: PROLACTIN No results found for: CHOL, TRIG, HDL, CHOLHDL, VLDL, LDLCALC  Physical Findings: AIMS: Facial and Oral Movements Muscles of Facial Expression: None, normal Lips and Perioral Area: None, normal Jaw: None, normal Tongue: None, normal,Extremity Movements Upper (arms, wrists, hands, fingers): None, normal Lower (legs, knees, ankles,  toes): None, normal, Trunk Movements Neck, shoulders, hips: None, normal, Overall Severity Severity of abnormal movements (highest score from questions above): None, normal Incapacitation due to abnormal movements: None, normal Patient's awareness of abnormal movements (rate only patient's report): No Awareness, Dental Status Current problems with teeth and/or dentures?: Yes(missing teeth) Does patient usually wear dentures?: No  CIWA:    COWS:     Musculoskeletal: Strength & Muscle Tone: within normal limits Gait & Station: normal Patient leans: N/A  Psychiatric Specialty Exam: Physical Exam  Nursing note and vitals reviewed. Psychiatric: His speech is normal and behavior is normal. His affect is blunt. Thought content is delusional. Cognition and memory are impaired. He expresses impulsivity.    Review of Systems  Neurological: Negative.   Psychiatric/Behavioral: Negative.   All other systems reviewed and are negative.   Blood pressure (!) 136/95, pulse 78, temperature 98.5 F (36.9 C), temperature source Oral, resp. rate 18, height 5\' 7"  (1.702 m), weight 84.4 kg, SpO2 99 %.Body mass index is 29.13 kg/m.  General Appearance: Casual  Eye Contact:  Good  Speech:  Clear and Coherent  Volume:  Normal  Mood:  Euthymic  Affect:  Blunt  Thought Process:  Disorganized and Descriptions of Associations: Tangential  Orientation:  Full (Time, Place, and Person)  Thought Content:  Delusions  Suicidal Thoughts:  No  Homicidal Thoughts:  No  Memory:  Immediate;   Poor Recent;   Poor Remote;   Poor  Judgement:  Poor  Insight:  Lacking  Psychomotor Activity:  Normal  Concentration:  Concentration: Poor and Attention Span: Poor  Recall:  Poor  Fund of Knowledge:  Poor  Language:  Fair  Akathisia:  No  Handed:  Right  AIMS (if indicated):     Assets:  Communication Skills Desire for Improvement Financial Resources/Insurance Housing Physical Health Resilience Social Support   ADL's:  Intact  Cognition:  WNL  Sleep:  Number of Hours: 7     Treatment Plan Summary: Daily contact with patient to assess and evaluate symptoms and progress in treatment and Medication management   [name] is a 63 year old male with a history of schizophrenia and cognitive decline who is hallucinating at baseline admitted foer ne aggressive behavior towards his peers at the nursing facility.  #Psychosis, improved -continue Zyprexa 5 mg nightly -QTc is prolonged at 503, we will not increase the antipsychotic  #Labs -lipid panel, TSH, A1C  #Smoking cessation -nicotine patch is available  #Social -incompetent adult  #Disposition -discharge back to his facility -follow up with his regular provider  Kristine LineaJolanta Zaquan Duffner, MD 12/01/2018, 4:31 PM

## 2018-12-01 NOTE — Progress Notes (Signed)
D: Pt denies SI/HI/AVH, can contract for safety. Pt. Spends a majority of the shift isolative and withdrawn to his room, only out to take his medications and grab a snack. Pt. Affect is animated and wide-eyed. Pt. Speech is slowed. Pt. Forwards little and is minimal. Pt. Denies everything when asked. Pt. Overall presentation is bizarre.   A: Q x 15 minute observation checks were completed for safety. Patient was provided with education, but is non-accepting of this and shows no evidence of learning. Patient was given/offered medications per orders. Patient  was encourage to attend groups, participate in unit activities and continue with plan of care. Pt. Chart and plans of care reviewed. Pt. Given support and encouragement.   R: Patient is complaint with medication, but does not attend or participate in unit activities or groups. Pt. Did come up and grab a snack to eat. Pt. Appears to be eating good.              Precautionary checks every 15 minutes for safety maintained, room free of safety hazards, patient sustains no injury or falls during this shift. Will endorse care to next shift.

## 2018-12-01 NOTE — Plan of Care (Signed)
Pt. Is complaint with medications. Pt. Denies si/hi/avh, can contract for safety. Pt. Participation with unit activities and groups absent. Pt. Isolative and withdrawn.    Problem: Activity: Goal: Interest or engagement in activities will improve Outcome: Not Progressing   Problem: Health Behavior/Discharge Planning: Goal: Compliance with treatment plan for underlying cause of condition will improve Outcome: Progressing   Problem: Safety: Goal: Periods of time without injury will increase Outcome: Progressing

## 2018-12-01 NOTE — [facility] Group Notes (Signed)
Feelings Around Diagnosis 12/01/2018 1PM  Type of Therapy/Topic:  Group Therapy:  Feelings about Diagnosis  Participation Level:  Did Not Attend   Description of Group:   This group will allow patients to explore their thoughts and feelings about diagnoses they have received. Patients will be guided to explore their level of understanding and acceptance of these diagnoses. Facilitator will encourage patients to process their thoughts and feelings about the reactions of others to their diagnosis and will guide patients in identifying ways to discuss their diagnosis with significant others in their lives. This group will be process-oriented, with patients participating in exploration of their own experiences, giving and receiving support, and processing challenge from other group members.   Therapeutic Goals: 1. Patient will demonstrate understanding of diagnosis as evidenced by identifying two or more symptoms of the disorder 2. Patient will be able to express two feelings regarding the diagnosis 3. Patient will demonstrate their ability to communicate their needs through discussion and/or role play  Summary of Patient Progress:       Therapeutic Modalities:   Cognitive Behavioral Therapy Brief Therapy Feelings Identification    Suzan Slick, LCSW 12/01/2018 2:04 PM

## 2018-12-01 NOTE — Progress Notes (Signed)
Recreation Therapy Notes  Date:12/01/2018  Time:9:30 am  Location:Craft room  Behavioral response:N/A  Intervention Topic: Goals  Discussion/Intervention: Patient did not attend group.  Clinical Observations/Feedback:  Patient did not attend group.  Jolane Bankhead LRT/CTRS        Legna Mausolf 12/01/2018 10:43 AM

## 2018-12-01 NOTE — Progress Notes (Signed)
D - Patient was in his room upon arrival to the unit. Patient was moved to another room. Patient was pleasant during assessment and medication administration. Patient denies SI/HI/AVH, pain, anxiety and depression. Patient stated to this writer, "I am going home tomorrow and I am ready. I feel better than when I first got here."  A - Patient compliant with medication administration per MD orders and procedures on the unit. Patient given education. Patient given support and encouragement. Patient informed to let staff know if there are any issues or problems on the unit.   R - Patient being monitored Q 15 minutes for safety per unit protocol. Patient remains safe on the unit at this time.

## 2018-12-01 NOTE — BHH Counselor (Signed)
CSW attempted to complete assessment on client twice.  First time the patient displayed disorganized thinking and CSW was unsure if assessment could be completed.  CSW spoke with senior CSW for further support and information on how to proceed.  CSW went second time to complete the PSA for the patient and patient declined stating his teeth hurt.    Patient has a legal guardian per chart.  CSW was unable to find any guardianship paperwork at this time.  Patient is unable to sign for himself.    CSW has been informed by nurse that the patient is being discharged at this time.  Due to client's disorganized thinking and declining to complete PSA, PSA has not been completed prior to discharge.  Penni Homans, MSW, LCSW 12/01/2018 4:14 PM

## 2018-12-01 NOTE — BHH Suicide Risk Assessment (Addendum)
[facility] Discharge Suicide Risk Assessment   Principal Problem: Undifferentiated schizophrenia [facility]) Discharge Diagnoses: Principal Problem:   Undifferentiated schizophrenia (HCC) Active Problems:   Neurocognitive disorder   Tobacco use disorder   Total Time spent with patient: 20 minutes  Musculoskeletal: Strength & Muscle Tone: within normal limits Gait & Station: normal Patient leans: N/A  Psychiatric Specialty Exam: Review of Systems  Neurological: Negative.   Psychiatric/Behavioral: Negative.   All other systems reviewed and are negative.   Blood pressure 135/87, pulse 73, temperature 98.2 F (36.8 C), temperature source Oral, resp. rate 16, height 5\' 7"  (1.702 m), weight 84.4 kg, SpO2 94 %.Body mass index is 29.13 kg/m.  General Appearance: Casual  Eye Contact::  Good  Speech:  Clear and Coherent409  Volume:  Normal  Mood:  Euthymic  Affect:  Appropriate  Thought Process:  Irrelevant and Descriptions of Associations: Tangential  Orientation:  Full (Time, Place, and Person)  Thought Content:  Delusions  Suicidal Thoughts:  No  Homicidal Thoughts:  No  Memory:  Immediate;   Poor Recent;   Poor Remote;   Poor  Judgement:  Poor  Insight:  Lacking  Psychomotor Activity:  Normal  Concentration:  Poor  Recall:  Poor  Fund of Knowledge:Poor  Language: Poor  Akathisia:  No  Handed:  Right  AIMS (if indicated):     Assets:  Communication Skills Desire for Improvement Financial Resources/Insurance Housing Physical Health Resilience Social Support  Sleep:  Number of Hours: 7.25  Cognition: WNL  ADL's:  Intact   Mental Status Per Nursing Assessment::   On Admission:  Intention to act on plan to harm others  Demographic Factors:  Male  Loss Factors: NA  Historical Factors: Impulsivity  Risk Reduction Factors:   Sense of responsibility to family, Living with another person, especially a relative, Positive social support and Positive therapeutic  relationship  Continued Clinical Symptoms:  Schizophrenia:   Paranoid or undifferentiated type  Cognitive Features That Contribute To Risk:  Loss of executive function    Suicide Risk:  Minimal: No identifiable suicidal ideation.  Patients presenting with no risk factors but with morbid ruminations; may be classified as minimal risk based on the severity of the depressive symptoms  Follow-up Information    Pc, [employer]. Go to.   Why:  Please attend walk in hours from 9-4, Monday-Friday for screening into services. Contact information: [address] [place] [address] [phone]           Plan Of Care/Follow-up recommendations:  Activity:  as tolerated Diet:  low sodium heart healthy Other:  keep follow up appointments  Kristine Linea, MD 12/02/2018, 8:52 AM

## 2018-12-01 NOTE — BHH Counselor (Signed)
CSW contacted [facility] to identify the patient's legal guardian and inform of possible discharge.  CSW was provided the name for Lance Bautista [phone], the administrator.  CSW and Interim BMU Director spoke with Ms. Lance Bautista who reported that the patient's legal guardian is Lance Bautista at [facility], she did not have contact information at this time and reports that she has already provided this information to someone.  CSW assessed for transportation and was informed that at this time the group home does not have transportation.  CSW was informed that the patient can return to group home.  Ms. Lance Bautista had questions regarding the patients medications and cCSW referred to the pt's doctor who can address those questions better.  CSW informed that pt's doctor will be calling when available.     Penni Homans, MSW, LCSW 12/01/2018 4:27 PM

## 2018-12-01 NOTE — Plan of Care (Signed)
Patient stated he was doing better today and that he was ready to leave tomorrow. Patient was pleasant during assessment and medication administration.   Problem: Education: Goal: Emotional status will improve Outcome: Progressing Goal: Mental status will improve Outcome: Progressing

## 2018-12-01 NOTE — Plan of Care (Signed)
Patient is alert to person, and situation. Patient forwards very little during assessment today patient continue to repeat, " I am just getting to old to be acting a fool." Patient denies SI, HI and AVH, seems to be preoccupied but pleasant, taking medications. Pleasant with peers on the unit and staff. No self harming behaviors or aggressive behaviors towards staff observed thus far. Safety checks Q 15 minutes to continue. Problem: Education: Goal: Knowledge of [facility] Education information/materials will improve Outcome: Progressing Goal: Emotional status will improve Outcome: Progressing Goal: Mental status will improve Outcome: Progressing Goal: Verbalization of understanding the information provided will improve Outcome: Progressing   Problem: Activity: Goal: Interest or engagement in activities will improve Outcome: Progressing Goal: Sleeping patterns will improve Outcome: Progressing   Problem: Coping: Goal: Ability to verbalize frustrations and anger appropriately will improve Outcome: Progressing Goal: Ability to demonstrate self-control will improve Outcome: Progressing

## 2018-12-02 NOTE — Discharge Summary (Signed)
Physician Discharge Summary Note  Patient:  Lance Bautista is an 63 y.o., male MRN:  [id number] DOB:  24-Nov-1955 Patient phone:  [phone] (home)  Patient address:   [phone] [facility] [place] [id number],  Total Time spent with patient: 20 minutes plus 15 min on care coordination and documentation  Date of Admission:  11/30/2018 Date of Discharge: 12/02/2018  Reason for Admission:  Psychotic break.  History of Present Illness:   Identifying data. [name] is a 63 year old male with history of schizophrenia.   Chief complaint. "You need to understand my family history."   History of present illness. Information was obtained from the patient, the chart and from his guardian. The patient was brought to the ER fro [facility] for agitation and threatening his peers. He was upset about "children". He has been at this facility for over a year. Even though he is psychotic at baseline and shows signs of cognitive decline, he has not been aggressive or agitated. Per his guardian. He has been prescribed 5 mg of Zyprexa all along.  On direct questioning, he denies any symptoms of depression, anxiety or psychosis. There are no somatic complaints. Sleep and appetite are good. He believes tah moths wash is the only medication he has ben taking.  The patient knows he lives in a nursing home and wants to return there "as soon as possible to be nice to everybody". He does not remember threatening or agitation and wants to share his family history. He has several brothers, a Production assistant, radiositer and parent who "disrespected him" all his life.   Past psychiatric history. Long history of mental illness with many incarcerations. According to the patient, he was in prison for 42 years. After he was found living in the woods, [facility] over one year.   Family psychiatric history. Reports none.   Social history. Disabled from mental illness. He is incompetent adult. Lives  in a family care home.   Principal Problem: Undifferentiated schizophrenia [facility](HCC) Discharge Diagnoses: Principal Problem:   Undifferentiated schizophrenia (HCC) Active Problems:   Neurocognitive disorder   Tobacco use disorder   Past Medical History:  Past Medical History:  Diagnosis Date  . Schizophrenia (HCC)    History reviewed. No pertinent surgical history. Family History: History reviewed. No pertinent family history.  Social History   Substance and Sexual Activity  Alcohol Use Not Currently     Social History   Substance and Sexual Activity  Drug Use Not Currently    Social History   Socioeconomic History  . Marital status: Single    Spouse name: Not on file  . Number of children: Not on file  . Years of education: Not on file  . Highest education level: Not on file  Occupational History  . Not on file  Social Needs  . Financial resource strain: Not on file  . Food insecurity:    Worry: Not on file    Inability: Not on file  . Transportation needs:    Medical: Not on file    Non-medical: Not on file  Tobacco Use  . Smoking status: Current Every Day Smoker    Packs/day: 1.00    Years: 10.00    Pack years: 10.00  . Smokeless tobacco: Never Used  Substance and Sexual Activity  . Alcohol use: Not Currently  . Drug use: Not Currently  . Sexual activity: Not on file  Lifestyle  . Physical activity:  Days per week: Not on file    Minutes per session: Not on file  . Stress: Not on file  Relationships  . Social connections:    Talks on phone: Not on file    Gets together: Not on file    Attends religious service: Not on file    Active member of club or organization: Not on file    Attends meetings of clubs or organizations: Not on file    Relationship status: Not on file  Other Topics Concern  . Not on file  Social History Narrative  . Not on file    Hospital Course:    [name] is a 63 year old male with a history of schizophrenia and  cognitive decline, who is hallucinating at baseline, admitted for new aggressive behavior towards his peers at the nursing facility. He was continued on his regular medications with improvement. At the time of discharge, the patient is cool and collected. He is compliabt with medications. He is able to contract for safety. He is forward thinking and optimistic about the future.   #Psychosis, improved  -continue Zyprexa 5 mg nightly  -QTc is prolonged at 503, we will not increase the antipsychotic   #Gingivitis -continue mouth wash  #Labs  -lipid panel with elevated TG, TSH and A1C are normal  #Smoking cessation  -nicotine patch is available   #Social  -incompetent adult  --[facility] DSS [phone] is the guardian   #Disposition  -discharge back to his facility, left a message with the guardian  -follow up with [facility]   Physical Findings: AIMS: Facial and Oral Movements Muscles of Facial Expression: None, normal Lips and Perioral Area: None, normal Jaw: None, normal Tongue: None, normal,Extremity Movements Upper (arms, wrists, hands, fingers): None, normal Lower (legs, knees, ankles, toes): None, normal, Trunk Movements Neck, shoulders, hips: None, normal, Overall Severity Severity of abnormal movements (highest score from questions above): None, normal Incapacitation due to abnormal movements: None, normal Patient's awareness of abnormal movements (rate only patient's report): No Awareness, Dental Status Current problems with teeth and/or dentures?: Yes(missing teeth) Does patient usually wear dentures?: No  CIWA:    COWS:     Musculoskeletal: Strength & Muscle Tone: within normal limits Gait & Station: normal Patient leans: N/A  Psychiatric Specialty Exam: Physical Exam  Nursing note and vitals reviewed. Psychiatric: His speech is normal and behavior is normal. Thought content normal. His affect is blunt. Cognition and memory are normal. He expresses  impulsivity.    Review of Systems  Neurological: Negative.   Psychiatric/Behavioral: Negative.   All other systems reviewed and are negative.   Blood pressure 135/87, pulse 73, temperature 98.2 F (36.8 C), temperature source Oral, resp. rate 16, height 5\' 7"  (1.702 m), weight 84.4 kg, SpO2 94 %.Body mass index is 29.13 kg/m.  General Appearance: Casual  Eye Contact:  Good  Speech:  Clear and Coherent  Volume:  Normal  Mood:  Euthymic  Affect:  Blunt  Thought Process:  Disorganized and Irrelevant  Orientation:  Full (Time, Place, and Person)  Thought Content:  Delusions  Suicidal Thoughts:  No  Homicidal Thoughts:  No  Memory:  Immediate;   Poor Recent;   Poor Remote;   Poor  Judgement:  Poor  Insight:  Lacking  Psychomotor Activity:  Normal  Concentration:  Concentration: Poor and Attention Span: Poor  Recall:  Poor  Fund of Knowledge:  Poor  Language:  Fair  Akathisia:  No  Handed:  Right  AIMS (if  indicated):     Assets:  Communication Skills Desire for Improvement Financial Resources/Insurance Housing Physical Health Resilience Social Support  ADL's:  Intact  Cognition:  WNL  Sleep:  Number of Hours: 7.25        Has this patient used any form of tobacco in the last 30 days? (Cigarettes, Smokeless Tobacco, Cigars, and/or Pipes) Yes, Yes, A prescription for an FDA-approved tobacco cessation medication was offered at discharge and the patient refused  Blood Alcohol level:  Lab Results  Component Value Date   ETH <10 11/28/2018    Metabolic Disorder Labs:  Lab Results  Component Value Date   HGBA1C 4.6 (L) 12/01/2018   MPG 85.32 12/01/2018   No results found for: PROLACTIN Lab Results  Component Value Date   CHOL 123 12/01/2018   TRIG 291 (H) 12/01/2018   HDL 40 (L) 12/01/2018   CHOLHDL 3.1 12/01/2018   VLDL 58 (H) 12/01/2018   LDLCALC 25 12/01/2018    See Psychiatric Specialty Exam and Suicide Risk Assessment completed by Attending Physician  prior to discharge.  Discharge destination:  Home  Is patient on multiple antipsychotic therapies at discharge:  No   Has Patient had three or more failed trials of antipsychotic monotherapy by history:  No  Recommended Plan for Multiple Antipsychotic Therapies: NA  Discharge Instructions    Diet - low sodium heart healthy   Complete by:  As directed    Increase activity slowly   Complete by:  As directed      Allergies as of 12/02/2018   No Known Allergies     Medication List    TAKE these medications     Indication  chlorhexidine 0.12 % solution Commonly known as:  PERIDEX Use as directed 15 mLs in the mouth or throat 2 (two) times daily.  Indication:  Tooth Plaque, Gum Inflammation   OLANZapine 5 MG tablet Commonly known as:  ZYPREXA Take 5 mg by mouth at bedtime.  Indication:  Schizophrenia      Follow-up Information    Pc, [facility]. Go to.   Why:  Please attend walk in hours from 9-4, Monday-Friday for screening into services. Contact information: [address] [place] [address] [phone]           Follow-up recommendations:  Activity:  as tolerated Diet:  low sodium heart healthy Other:  keep follow up appointments  Comments:     Signed: Kristine LineaJolanta Lodie Waheed, MD 12/02/2018, 8:53 AM

## 2018-12-02 NOTE — BHH Suicide Risk Assessment (Signed)
BHH INPATIENT:  Family/Significant Other Suicide Prevention Education  Suicide Prevention Education:  Education Completed; Festus Holts, [phone], [facility] DSS has been identified by the patient as the family member/significant other with whom the patient will be residing, and identified as the person(s) who will aid the patient in the event of a mental health crisis (suicidal ideations/suicide attempt).  With written consent from the patient, the family member/significant other has been provided the following suicide prevention education, prior to the and/or following the discharge of the patient.  The suicide prevention education provided includes the following:  Suicide risk factors  Suicide prevention and interventions  National Suicide Hotline telephone number  [facility] assessment telephone number  [facility] Emergency Assistance 911  [facility] and/or Residential Mobile Crisis Unit telephone number  Request made of family/significant other to:  Remove weapons (e.g., guns, rifles, knives), all items previously/currently identified as safety concern.    Remove drugs/medications (over-the-counter, prescriptions, illicit drugs), all items previously/currently identified as a safety concern.  The family member/significant other verbalizes understanding of the suicide prevention education information provided.  The family member/significant other agrees to remove the items of safety concern listed above.  Harden Mo 12/02/2018, 4:01 PM

## 2018-12-02 NOTE — Tx Team (Signed)
Interdisciplinary Treatment and Diagnostic Plan Update  12/02/2018 Time of Session: 10:30AM Lance LabradorWayne Bautista MRN: [id number]  Principal Diagnosis: Undifferentiated schizophrenia [facility](HCC)  Secondary Diagnoses: Principal Problem:   Undifferentiated schizophrenia (HCC) Active Problems:   Neurocognitive disorder   Tobacco use disorder   Current Medications:  Current Facility-Administered Medications  Medication Dose Route Frequency Provider Last Rate Last Dose  . acetaminophen (TYLENOL) tablet 650 mg  650 mg Oral Q6H PRN Santo HeldIqbal, Tanvir, MD      . alum & mag hydroxide-simeth (MAALOX/MYLANTA) 200-200-20 MG/5ML suspension 30 mL  30 mL Oral Q4H PRN Santo HeldIqbal, Tanvir, MD      . chlorhexidine (PERIDEX) 0.12 % solution 15 mL  15 mL Mouth/Throat BID Santo HeldIqbal, Tanvir, MD   15 mL at 12/01/18 1723  . magnesium hydroxide (MILK OF MAGNESIA) suspension 30 mL  30 mL Oral Daily PRN Santo HeldIqbal, Tanvir, MD      . nicotine (NICODERM CQ - dosed in mg/24 hours) patch 21 mg  21 mg Transdermal Daily Pucilowska, Jolanta B, MD   21 mg at 12/01/18 0836  . OLANZapine (ZYPREXA) tablet 5 mg  5 mg Oral QHS Santo HeldIqbal, Tanvir, MD   5 mg at 12/01/18 2103   PTA Medications: Medications Prior to Admission  Medication Sig Dispense Refill Last Dose  . chlorhexidine (PERIDEX) 0.12 % solution Use as directed 15 mLs in the mouth or throat 2 (two) times daily.   Past Week at Unknown time  . OLANZapine (ZYPREXA) 5 MG tablet Take 5 mg by mouth at bedtime.   Past Week at Unknown time    Patient Stressors: Other: Aggression at Group Home  Patient Strengths: Average or above average intelligence Communication skills Physical Health Supportive family/friends  Treatment Modalities: Medication Management, Group therapy, Case management,  1 to 1 session with clinician, Psychoeducation, Recreational therapy.   Physician Treatment Plan for Primary Diagnosis: Undifferentiated schizophrenia (HCC) Long Term Goal(s): Improvement in symptoms so as ready for  discharge Improvement in symptoms so as ready for discharge   Short Term Goals: Ability to identify changes in lifestyle to reduce recurrence of condition will improve Ability to verbalize feelings will improve Ability to disclose and discuss suicidal ideas Ability to demonstrate self-control will improve Ability to identify and develop effective coping behaviors will improve Ability to maintain clinical measurements within normal limits will improve Compliance with prescribed medications will improve Ability to identify triggers associated with substance abuse/mental health issues will improve NA  Medication Management: Evaluate patient's response, side effects, and tolerance of medication regimen.  Therapeutic Interventions: 1 to 1 sessions, Unit Group sessions and Medication administration.  Evaluation of Outcomes: Adequate for Discharge  Physician Treatment Plan for Secondary Diagnosis: Principal Problem:   Undifferentiated schizophrenia (HCC) Active Problems:   Neurocognitive disorder   Tobacco use disorder  Long Term Goal(s): Improvement in symptoms so as ready for discharge Improvement in symptoms so as ready for discharge   Short Term Goals: Ability to identify changes in lifestyle to reduce recurrence of condition will improve Ability to verbalize feelings will improve Ability to disclose and discuss suicidal ideas Ability to demonstrate self-control will improve Ability to identify and develop effective coping behaviors will improve Ability to maintain clinical measurements within normal limits will improve Compliance with prescribed medications will improve Ability to identify triggers associated with substance abuse/mental health issues will improve NA     Medication Management: Evaluate patient's response, side effects, and tolerance of medication regimen.  Therapeutic Interventions: 1 to 1 sessions, Unit Group sessions and Medication  administration.  Evaluation  of Outcomes: Adequate for Discharge   RN Treatment Plan for Primary Diagnosis: Undifferentiated schizophrenia (HCC) Long Term Goal(s): Knowledge of disease and therapeutic regimen to maintain health will improve  Short Term Goals: Ability to demonstrate self-control, Ability to participate in decision making will improve, Ability to verbalize feelings will improve, Ability to identify and develop effective coping behaviors will improve and Compliance with prescribed medications will improve  Medication Management: RN will administer medications as ordered by provider, will assess and evaluate patient's response and provide education to patient for prescribed medication. RN will report any adverse and/or side effects to prescribing provider.  Therapeutic Interventions: 1 on 1 counseling sessions, Psychoeducation, Medication administration, Evaluate responses to treatment, Monitor vital signs and CBGs as ordered, Perform/monitor CIWA, COWS, AIMS and Fall Risk screenings as ordered, Perform wound care treatments as ordered.  Evaluation of Outcomes: Adequate for Discharge   LCSW Treatment Plan for Primary Diagnosis: Undifferentiated schizophrenia (HCC) Long Term Goal(s): Safe transition to appropriate next level of care at discharge, Engage patient in therapeutic group addressing interpersonal concerns.  Short Term Goals: Engage patient in aftercare planning with referrals and resources, Increase social support, Increase ability to appropriately verbalize feelings and Increase emotional regulation  Therapeutic Interventions: Assess for all discharge needs, 1 to 1 time with Social worker, Explore available resources and support systems, Assess for adequacy in community support network, Educate family and significant other(s) on suicide prevention, Complete Psychosocial Assessment, Interpersonal group therapy.  Evaluation of Outcomes: Adequate for Discharge   Progress in Treatment: Attending  groups: No. Participating in groups: No. Taking medication as prescribed: Yes. Toleration medication: Yes. Family/Significant other contact made: Yes, individual(s) contacted:  CSW spoke with legal guardian and group home Patient understands diagnosis: No. Discussing patient identified problems/goals with staff: Yes. Medical problems stabilized or resolved: Yes. Denies suicidal/homicidal ideation: Yes. Issues/concerns per patient self-inventory: No. Other: none  New problem(s) identified: No, Describe:  none  New Short Term/Long Term Goal(s):  Patient Goals:  "get as much respect as I can"  Discharge Plan or Barriers:   Reason for Continuation of Hospitalization: Delusions  Hallucinations Medication stabilization  Estimated Length of Stay:  Attendees: Patient: Lance Bautista 12/02/2018 12:52 PM  Physician: Dr. Jennet MaduroPucilowska, MD 12/02/2018 12:52 PM  Nursing:  12/02/2018 12:52 PM  RN Care Manager: 12/02/2018 12:52 PM  Social Worker: Penni HomansMichaela Jayr Lupercio, LCSW 12/02/2018 12:52 PM  Recreational Therapist: Hilbert BibleShay Outlaw, CTRS, LRT 12/02/2018 12:52 PM  Other:  12/02/2018 12:52 PM  Other:  12/02/2018 12:52 PM  Other: 12/02/2018 12:52 PM    Scribe for Treatment Team: Harden MoMichaela J Makensie Mulhall, LCSW 12/02/2018 12:52 PM

## 2018-12-02 NOTE — Progress Notes (Signed)
  [facility] Adult Case Management Discharge Plan :  Will you be returning to the same living situation after discharge:  Yes,  pt returning to group home At discharge, do you have transportation home?: Yes,  group home will provide transportation Do you have the ability to pay for your medications: Yes,  Medicaid  Release of information consent forms completed and in the chart;  Patient's signature needed at discharge.  Patient to Follow up at: Follow-up Information    Services, [facility] Follow up.   Why:  Please attend the following appointments: Walk in 12/03/18 at 9:30AM. Psych eval is scheduled for 12/10/2018 at 10:40AM Contact information: [address] [place] [address] [phone]           Next level of care provider has access to [facility] Link:no  Safety Planning and Suicide Prevention discussed: Yes,  CSW spoke with group home     Has patient been referred to the Quitline?: N/A patient is not a smoker  Patient has been referred for addiction treatment: N/A  Harden Mo, LCSW 12/02/2018, 10:10 AM

## 2018-12-02 NOTE — BHH Counselor (Signed)
CSW attempted to contact pt's guardian, Festus Holts [facility] DSS, [phone], however was unable to speak with her.  CSW left message requesting return phone call.   Penni Homans, MSW, LCSW 12/02/2018 8:54 AM

## 2018-12-02 NOTE — Progress Notes (Signed)
RN called Meriam Sprague [phone] with the patient's group home, there was no answer.

## 2018-12-02 NOTE — [place] FL2 (Signed)
  [place] MEDICAID FL2 LEVEL OF CARE SCREENING TOOL     IDENTIFICATION  Patient Name: Lance Bautista Birthdate: 1956/10/23 Sex: male Admission Date (Current Location): 11/30/2018  [place] and [place] Number:  Geophysical data processor and Address:  [facility], [address] [place], [place] [address]      Provider Number: [id number]  Attending Physician Name and Address:  Shari Prows, MD  Relative Name and Phone Number:       Current Level of Care: Hospital Recommended Level of Care: Other (Comment)(Group home) Prior Approval Number:    Date Approved/Denied:   PASRR Number:    Discharge Plan: Other (Comment)(group home)    Current Diagnoses: Patient Active Problem List   Diagnosis Date Noted  . Tobacco use disorder 11/30/2018  . 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 Neurocognitive disorder 11/29/2018    Orientation RESPIRATION BLADDER Height & Weight     Self, Time  Normal Continent Weight: 84.4 kg Height:  5\' 7"  (170.2 cm)  BEHAVIORAL SYMPTOMS/MOOD NEUROLOGICAL BOWEL NUTRITION STATUS      Continent Diet(regular)  AMBULATORY STATUS COMMUNICATION OF NEEDS Skin     Verbally Normal                       Personal Care Assistance Level of Assistance              Functional Limitations Info             SPECIAL CARE FACTORS FREQUENCY                       Contractures Contractures Info: Not present    Additional Factors Info  Code Status(Full) Code Status Info: Full code             Current Medications (12/02/2018):  This is the current hospital active medication list Current Facility-Administered Medications  Medication Dose Route Frequency Provider Last Rate Last Dose  . acetaminophen (TYLENOL) tablet 650 mg  650 mg Oral Q6H PRN Santo Held, MD      . alum & mag hydroxide-simeth (MAALOX/MYLANTA) 200-200-20 MG/5ML suspension 30 mL  30 mL Oral Q4H PRN Santo Held, MD      .  chlorhexidine (PERIDEX) 0.12 % solution 15 mL  15 mL Mouth/Throat BID Santo Held, MD   15 mL at 12/01/18 1723  . magnesium hydroxide (MILK OF MAGNESIA) suspension 30 mL  30 mL Oral Daily PRN Santo Held, MD      . nicotine (NICODERM CQ - dosed in mg/24 hours) patch 21 mg  21 mg Transdermal Daily Pucilowska, Jolanta B, MD   21 mg at 12/01/18 0836  . OLANZapine (ZYPREXA) tablet 5 mg  5 mg Oral QHS Santo Held, MD   5 mg at 12/01/18 2103     Discharge Medications: Please see discharge summary for a list of discharge medications.  Relevant Imaging Results:  Relevant Lab Results:   Additional Information    Harden Mo, LCSW

## 2018-12-02 NOTE — Progress Notes (Signed)
Recreation Therapy Notes   Date: 12/02/2018  Time: 9:30 am   Location: Craft room   Behavioral response: N/A   Intervention Topic: Coping  Discussion/Intervention: Patient did not attend group.   Clinical Observations/Feedback:  Patient did not attend group.   Connelly [place] LRT/CTRS         Alyn Riedinger 12/02/2018 12:32 PM 

## 2018-12-02 NOTE — BHH Group Notes (Signed)
LCSW Group Therapy Note  12/02/2018 1:00 PM  Type of Therapy/Topic:  Group Therapy:  Emotion Regulation  Participation Level:  Did Not Attend   Description of Group:   The purpose of this group is to assist patients in learning to regulate negative emotions and experience positive emotions. Patients will be guided to discuss ways in which they have been vulnerable to their negative emotions. These vulnerabilities will be juxtaposed with experiences of positive emotions or situations, and patients will be challenged to use positive emotions to combat negative ones. Special emphasis will be placed on coping with negative emotions in conflict situations, and patients will process healthy conflict resolution skills.  Therapeutic Goals: 1. Patient will identify two positive emotions or experiences to reflect on in order to balance out negative emotions 2. Patient will label two or more emotions that they find the most difficult to experience 3. Patient will demonstrate positive conflict resolution skills through discussion and/or role plays  Summary of Patient Progress:       Therapeutic Modalities:   Cognitive Behavioral Therapy Feelings Identification Dialectical Behavioral Therapy  Penni Homans, MSW, LCSW 12/02/2018 4:03 PM

## 2018-12-02 NOTE — BHH Counselor (Signed)
Adult Comprehensive Assessment  Patient ID: Lance Bautista, male   DOB: [birth year], 63 y.o.   MRN: [id number]  Information Source: Information source: Patient  Current Stressors:  Patient states their primary concerns and needs for treatment are:: Pt reports "I been in prison since 1977, life sentence. I am just getting out today."  Review of pts chart indicate he was brought in for aggresive behavior. Patient states their goals for this hospitilization and ongoing recovery are:: Pt reports "as much respect as possible."  Living/Environment/Situation:  Living Arrangements: Group Home Living conditions (as described by patient or guardian): Pt reports "I can't stay there with all those [employer] trucks going up and down the road." Who else lives in the home?: Others in the group home. How long has patient lived in current situation?: Pt reports "I dont know." What is atmosphere in current home: Chaotic  Family History:  Marital status: Single Are you sexually active?: Yes What is your sexual orientation?: Heterosexual Has your sexual activity been affected by drugs, alcohol, medication, or emotional stress?: Pt denies. Does patient have children?: Yes How many children?: 0(Pt reports that he does not know how many children he has but is adamant that he has several.) How is patient's relationship with their children?: Pt reports that he does not have a relationship with any children.  Childhood History:  By whom was/is the patient raised?: Both parents Description of patient's relationship with caregiver when they were a child: Pt reports "we got along". Patient's description of current relationship with people who raised him/her: Pt did not respond to this question. How were you disciplined when you got in trouble as a child/adolescent?: Pt reports "I got whoopings". Does patient have siblings?: Yes Number of Siblings: 5 Description of patient's current relationship with siblings: Pt  reports "I cant communicate with them." Did patient suffer any verbal/emotional/physical/sexual abuse as a child?: No Did patient suffer from severe childhood neglect?: No Has patient ever been sexually abused/assaulted/raped as an adolescent or adult?: No Was the patient ever a victim of a crime or a disaster?: No Witnessed domestic violence?: No Has patient been effected by domestic violence as an adult?: No  Education:  Highest grade of school patient has completed: Pt reports "7th grade".  Pt reports that at that time he began to work with his older brother. Currently a student?: No Learning disability?: No  Employment/Work Situation:   Employment situation: Unemployed Patient's job has been impacted by current illness: No What is the longest time patient has a held a job?: Pt reports "all my life".  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  Alcohol/Substance Abuse:   What has been your use of drugs/alcohol within the last 12 months?: Pt reports "I smoke crack rock and weed every chance I can get, every day, as much as possible."  CSW notes that per the chart the pt did not test postive for any susbtances. If attempted suicide, did drugs/alcohol play a role in this?: No Alcohol/Substance Abuse Treatment Hx: Denies past history Has alcohol/substance abuse ever caused legal problems?: No  Social Support System:   Patient's Community Support System: None Describe Community Support System: Pt reports "it's just me out here by myself." Type of  faith/religion: Pt denies. How does patient's faith help to cope with current illness?: Pt denies.  Leisure/Recreation:   Leisure and Hobbies: Pt reports "smoke that dope."  Strengths/Needs:   What is the patient's perception of their strengths?: Pt reports "I work on cars." Patient states they can use these personal strengths during their treatment to contribute to their recovery: Pt denies. Patient states these barriers may affect/interfere with their treatment: Pt reports "when I get money I want that dope." Patient states these barriers may affect their return to the community: Pt denies.  Discharge Plan:   Currently receiving community mental health services: No Patient states concerns and preferences for aftercare planning are: Pt did not respnd. Patient states they will know when they are safe and ready for discharge when: Pt reports "I am ready to go." Does patient have access to transportation?: Yes Does patient have financial barriers related to discharge medications?: No Patient description of barriers related to discharge medications: Pt denies. Will patient be returning to same living situation after discharge?: Yes  Summary/Recommendations:   Summary and Recommendations (to be completed by the evaluator): Patient is a 63 year old, single male living in [place], [place] [facility]  Patient has [employer].  Patient is currenlty in a group home and has plans to return to the gruop home upon dishcarge.  Pt does not have a current provider and legal guardian has requested support with scheudling an [facility] [place].  Patient has a diagnosis of Undifferentiated schizophrenia.  Recommendations for patione include: crisis stabilization, therapeutic milieu, encourage group attendance and participation, medication management for mood stabilization and development of comprehensive mental wellness plan.    Harden MoMichaela J Carigan Lister.LCSW   12/02/2018

## 2019-01-05 DIAGNOSIS — F209 Schizophrenia, unspecified: Secondary | ICD-10-CM | POA: Diagnosis not present

## 2019-01-05 DIAGNOSIS — R7309 Other abnormal glucose: Secondary | ICD-10-CM | POA: Diagnosis not present

## 2019-01-05 DIAGNOSIS — E785 Hyperlipidemia, unspecified: Secondary | ICD-10-CM | POA: Diagnosis not present

## 2019-01-05 DIAGNOSIS — R7989 Other specified abnormal findings of blood chemistry: Secondary | ICD-10-CM | POA: Diagnosis not present

## 2019-01-05 DIAGNOSIS — B182 Chronic viral hepatitis C: Secondary | ICD-10-CM | POA: Diagnosis not present

## 2019-01-05 DIAGNOSIS — Z13220 Encounter for screening for lipoid disorders: Secondary | ICD-10-CM | POA: Diagnosis not present

## 2019-01-05 DIAGNOSIS — Z0000 Encounter for general adult medical examination without abnormal findings: Secondary | ICD-10-CM | POA: Diagnosis not present

## 2019-01-25 DIAGNOSIS — M79674 Pain in right toe(s): Secondary | ICD-10-CM | POA: Diagnosis not present

## 2019-01-25 DIAGNOSIS — I739 Peripheral vascular disease, unspecified: Secondary | ICD-10-CM | POA: Diagnosis not present

## 2019-01-25 DIAGNOSIS — L84 Corns and callosities: Secondary | ICD-10-CM | POA: Diagnosis not present

## 2019-01-25 DIAGNOSIS — M2041 Other hammer toe(s) (acquired), right foot: Secondary | ICD-10-CM | POA: Diagnosis not present

## 2019-01-25 DIAGNOSIS — B351 Tinea unguium: Secondary | ICD-10-CM | POA: Diagnosis not present

## 2019-02-17 DIAGNOSIS — R7989 Other specified abnormal findings of blood chemistry: Secondary | ICD-10-CM | POA: Diagnosis not present

## 2019-03-02 DIAGNOSIS — F209 Schizophrenia, unspecified: Secondary | ICD-10-CM | POA: Diagnosis not present

## 2019-03-02 DIAGNOSIS — B182 Chronic viral hepatitis C: Secondary | ICD-10-CM | POA: Diagnosis not present

## 2019-04-16 DIAGNOSIS — K739 Chronic hepatitis, unspecified: Secondary | ICD-10-CM | POA: Diagnosis not present

## 2019-04-16 DIAGNOSIS — F17200 Nicotine dependence, unspecified, uncomplicated: Secondary | ICD-10-CM | POA: Diagnosis not present

## 2019-04-16 DIAGNOSIS — F209 Schizophrenia, unspecified: Secondary | ICD-10-CM | POA: Diagnosis not present

## 2019-04-16 DIAGNOSIS — F17210 Nicotine dependence, cigarettes, uncomplicated: Secondary | ICD-10-CM | POA: Diagnosis not present

## 2019-05-29 NOTE — Progress Notes (Deleted)
Referring Provider: Dr. Felecia ShellingFanta Primary Care Physician:  Avon GullyFanta, Tesfaye, MD Primary Gastroenterologist:  Dr. Jena Gaussourk  No chief complaint on file.   HPI:   Lance Bautista is a 63 y.o. male presenting today at the request of Dr. Felecia ShellingFanta for hepatitis.   Past medical history significant for schizophrenia, hallucinations ar base line. [facility].      Last Labs Jan 2020 with:  AST 54, ALT 32, Bilirubin 1.3. Platelets 248, Hemoglobin 15.2. A1C 4.6.   EKG 11/30/18 with QTc 503   Past Medical History:  Diagnosis Date  . Schizophrenia (HCC)     No past surgical history on file.  Current Outpatient Medications  Medication Sig Dispense Refill  . chlorhexidine (PERIDEX) 0.12 % solution Use as directed 15 mLs in the mouth or throat 2 (two) times daily.    Marland Kitchen. OLANZapine (ZYPREXA) 5 MG tablet Take 5 mg by mouth at bedtime.     No current facility-administered medications for this visit.     Allergies as of 05/31/2019  . (No Known Allergies)    No family history on file.  Social History   Socioeconomic History  . Marital status: Single    Spouse name: Not on file  . Number of children: Not on file  . Years of education: Not on file  . Highest education level: Not on file  Occupational History  . Not on file  Social Needs  . Financial resource strain: Not on file  . Food insecurity    Worry: Not on file    Inability: Not on file  . Transportation needs    Medical: Not on file    Non-medical: Not on file  Tobacco Use  . Smoking status: Current Every Day Smoker    Packs/day: 1.00    Years: 10.00    Pack years: 10.00  . Smokeless tobacco: Never Used  Substance and Sexual Activity  . Alcohol use: Not Currently  . Drug use: Not Currently  . Sexual activity: Not on file  Lifestyle  . Physical activity    Days per week: Not on file    Minutes per session: Not on file  . Stress: Not on file  Relationships  . Social Musicianconnections    Talks on phone: Not  on file    Gets together: Not on file    Attends religious service: Not on file    Active member of club or organization: Not on file    Attends meetings of clubs or organizations: Not on file    Relationship status: Not on file  . Intimate partner violence    Fear of current or ex partner: Not on file    Emotionally abused: Not on file    Physically abused: Not on file    Forced sexual activity: Not on file  Other Topics Concern  . Not on file  Social History Narrative  . Not on file    Review of Systems: Gen: Denies any fever, chills, fatigue, weight loss, lack of appetite.  CV: Denies chest pain, heart palpitations, peripheral edema, syncope.  Resp: Denies shortness of breath at rest or with exertion. Denies wheezing or cough.  GI: Denies dysphagia or odynophagia. Denies jaundice, hematemesis, fecal incontinence. GU : Denies urinary burning, urinary frequency, urinary hesitancy MS: Denies joint pain, muscle weakness, cramps, or limitation of movement.  Derm: Denies rash, itching, dry skin Psych: Denies depression, anxiety, memory loss, and confusion Heme: Denies bruising, bleeding, and enlarged lymph nodes.  Physical Exam: There were no vitals taken for this visit. General:   Alert and oriented. Pleasant and cooperative. Well-nourished and well-developed.  Head:  Normocephalic and atraumatic. Eyes:  Without icterus, sclera clear and conjunctiva pink.  Ears:  Normal auditory acuity. Nose:  No deformity, discharge,  or lesions. Mouth:  No deformity or lesions, oral mucosa pink.  Neck:  Supple, without mass or thyromegaly. Lungs:  Clear to auscultation bilaterally. No wheezes, rales, or rhonchi. No distress.  Heart:  S1, S2 present without murmurs appreciated.  Abdomen:  +BS, soft, non-tender and non-distended. No HSM noted. No guarding or rebound. No masses appreciated.  Rectal:  Deferred  Msk:  Symmetrical without gross deformities. Normal posture. Pulses:  Normal pulses  noted. Extremities:  Without clubbing or edema. Neurologic:  Alert and  oriented x4;  grossly normal neurologically. Skin:  Intact without significant lesions or rashes. Cervical Nodes:  No significant cervical adenopathy. Psych:  Alert and cooperative. Normal mood and affect.

## 2019-05-31 ENCOUNTER — Other Ambulatory Visit: Payer: Self-pay

## 2019-05-31 ENCOUNTER — Encounter: Payer: Self-pay | Admitting: Gastroenterology

## 2019-05-31 ENCOUNTER — Ambulatory Visit (INDEPENDENT_AMBULATORY_CARE_PROVIDER_SITE_OTHER): Payer: Medicare Other | Admitting: Gastroenterology

## 2019-05-31 ENCOUNTER — Ambulatory Visit: Payer: Medicaid Other | Admitting: Gastroenterology

## 2019-05-31 VITALS — BP 122/83 | HR 65 | Temp 98.2°F | Ht 66.0 in | Wt 191.8 lb

## 2019-05-31 DIAGNOSIS — B1920 Unspecified viral hepatitis C without hepatic coma: Secondary | ICD-10-CM

## 2019-05-31 DIAGNOSIS — Z114 Encounter for screening for human immunodeficiency virus [HIV]: Secondary | ICD-10-CM

## 2019-05-31 NOTE — Patient Instructions (Signed)
Attempted to submit PA for [place] abd via [employer]. No PA needed: NOTE: This [facility] member does not require prior authorization for OUTPATIENT Radiology through [place] or [place] [facility] at this time

## 2019-05-31 NOTE — Patient Instructions (Signed)
1. Please have labs completed at [facility] or at [facility] when you have your ultrasound.  We will call with results.   2. Please have ultrasound completed at [facility]. They should call with a day and time.   3. We will see you back in 4-6 weeks.   Aliene Altes, PA-C [facility]

## 2019-05-31 NOTE — Progress Notes (Signed)
Referring Provider: Dr. Legrand Rams Primary Care Physician:  Rosita Fire, MD Primary Gastroenterologist:  Dr. Gala Romney  Chief Complaint  Patient presents with  . Hepatitis C    PT said he has no idea why he is here that he is not having any problems    HPI:   Lance Bautista is a 63 y.o. male presenting today at the request of Dr. Legrand Rams for Hepatitis C. Patient has past medical history of schizophrenia. Also with QTc prolongation (503) on last EKG while inpatient for psychotic break in Jan 2020.   Patient had lab work in February 2020 with Hep C antibody positive with HCV RNA quantitative of 158000.  Patient presents today, brought by [facility] staff, but alone in the room. He was unaware why he was here and did not know he had hepatitis C. He denies abdominal pain, acid reflux, heartburn, and dyaphagia. BM daily and soft. No constipation or diarrhea. No hematochezia or melena. No yellow of eyes. No bruising or bleeding. No confusion. No swelling in his abdomen or lower extremities.   No fever, chills, fatigue, lightheadedness, dizziness, or feeling like he will pass out. No weight loss. Appetite is good. No CP or palpitations, SOB, cough or wheezing.   No history of blood transfusions, no IV drugs or intranasal drugs. 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 Reports he likes to drink alcohol and would drink every day, but he is not allowed to drink at [facility]. States he does smoke daily.   No prior colonoscopy.   Patient is not aware of medications he is taking. Conformation was obtained by calling [employer].     Past Medical History:  Diagnosis Date  . Schizophrenia ([place])     History reviewed. No pertinent surgical history.  Current Outpatient Medications  Medication Sig Dispense Refill  . chlorhexidine (PERIDEX) 0.12 % solution Use as directed 15 mLs in the mouth or throat 2 (two) times daily.    Marland Kitchen  OLANZapine (ZYPREXA) 5 MG tablet Take 5 mg by mouth at bedtime.     No current facility-administered medications for this visit.     Allergies as of 05/31/2019  . (No Known Allergies)    Family History  Problem Relation Age of Onset  . Colon cancer Neg Hx     Social History   Socioeconomic History  . Marital status: Single    Spouse name: Not on file  . Number of children: Not on file  . Years of education: Not on file  . Highest education level: Not on file  Occupational History  . Not on file  Social Needs  . Financial resource strain: Not on file  . Food insecurity    Worry: Not on file    Inability: Not on file  . Transportation needs    Medical: Not on file    Non-medical: Not on file  Tobacco Use  . Smoking status: Current Every Day Smoker    Packs/day: 1.00    Years: 10.00    Pack years: 10.00  . Smokeless tobacco: Never Used  . Tobacco comment: one pack per day  Substance and Sexual Activity  . Alcohol use: Not Currently  . Drug use: Not Currently  . Sexual activity: Not on file  Lifestyle  . Physical activity    Days per week: Not on file    Minutes per session: Not on file  . Stress: Not on  file  Relationships  . Social Musicianconnections    Talks on phone: Not on file    Gets together: Not on file    Attends religious service: Not on file    Active member of club or organization: Not on file    Attends meetings of clubs or organizations: Not on file    Relationship status: Not on file  . Intimate partner violence    Fear of current or ex partner: Not on file    Emotionally abused: Not on file    Physically abused: Not on file    Forced sexual activity: Not on file  Other Topics Concern  . Not on file  Social History Narrative  . Not on file    Review of Systems: Gen: See HPI.  CV: See HPI Resp: See HPI  GI: See HPI GU : Denies urinary burning, urinary frequency, urinary hesitancy MS: Denies joint pain, muscle weakness, cramps  Derm: Denies  rash, itching, dry skin Psych: Denies depression, anxiety Heme: Denies bruising, bleeding  Physical Exam: BP 122/83   Pulse 65   Temp 98.2 F (36.8 C) (Oral)   Ht 5\' 6"  (1.676 m)   Wt 191 lb 12.8 oz (87 kg)   BMI 30.96 kg/m  General:   Alert and oriented. Pleasant and cooperative. Well-nourished and well-developed.  Head:  Normocephalic and atraumatic. Eyes:  Without icterus, sclera clear and conjunctiva pink.  Ears:  Normal auditory acuity. Nose:  No deformity, discharge,  or lesions. Lungs:  Clear to auscultation bilaterally. No wheezes, rales, or rhonchi. No distress.  Heart:  S1, S2 present without murmurs appreciated.  Abdomen:  +BS, soft, non-tender and non-distended. No HSM noted. No guarding or rebound. No masses appreciated.  Rectal:  Deferred  Msk:  Symmetrical without gross deformities. Normal posture. Pulses:  Normal pulses noted. Extremities:  Without clubbing or edema. Neurologic:  Alert and  oriented x4;  grossly normal neurologically. Skin:  Intact without significant lesions or rashes. Psych:  Alert and cooperative. Normal mood. Flat affect.

## 2019-06-01 ENCOUNTER — Encounter: Payer: Self-pay | Admitting: Internal Medicine

## 2019-06-01 NOTE — Assessment & Plan Note (Addendum)
63 y.o. male with past medical history of schizophrenia with newly diagnosed Hepatitis C in Feb. 2020 with HCV RNA quantitative of 158000. History of incarceration, homelessness in the past, and tattoos in unregulated setting. No IV or intranasal drug use. History of alcohol use, but none at this time. Patient is asymptomatic. No upper or lower GI symptoms. No signs or symptoms of advanced liver disease. Labs in Feb 2020 with LFTs and bilirubin within normal limits. Creatinine normal. Hep B negative without immunity. No imaging has been performed.     No contraindications to treatment. As he is living at [facility] at this time, this will work to his advantage for treatment as they can monitor his medication administration.   Will obtain pre-treatment labs (Hep A Ab, Hep C Genotype, HIV Ab, CBC, HFP, BMP, INR) and US abdomen complete with electrography. Patient will need Hep B vaccination. However, waiting on Hep A results as he may also need Hep A vaccination.  Cautioned patient on not sharing personal belongings with others including razors and tooth burshes and ensuring he is covering any bleeding wounds.   After labs are completed, we should be able to submit for treatment. Plan to see patient back after we have submitted for treatment and have medications to discuss with patient and facility staff. It would be good for a staff member to accompany patient during the next visit while discuss medication administration.

## 2019-06-02 NOTE — Progress Notes (Signed)
CC'D TO PCP °

## 2019-06-03 ENCOUNTER — Other Ambulatory Visit: Payer: Self-pay | Admitting: Gastroenterology

## 2019-06-03 DIAGNOSIS — B1920 Unspecified viral hepatitis C without hepatic coma: Secondary | ICD-10-CM | POA: Diagnosis not present

## 2019-06-03 DIAGNOSIS — Z114 Encounter for screening for human immunodeficiency virus [HIV]: Secondary | ICD-10-CM | POA: Diagnosis not present

## 2019-06-07 ENCOUNTER — Ambulatory Visit (HOSPITAL_COMMUNITY)
Admission: RE | Admit: 2019-06-07 | Discharge: 2019-06-07 | Disposition: A | Discharge status: Home / Self Care | Payer: Medicare Other | Source: Ambulatory Visit | Attending: Gastroenterology | Admitting: Gastroenterology

## 2019-06-07 ENCOUNTER — Other Ambulatory Visit: Payer: Self-pay

## 2019-06-07 DIAGNOSIS — B1920 Unspecified viral hepatitis C without hepatic coma: Secondary | ICD-10-CM | POA: Diagnosis not present

## 2019-06-07 NOTE — Progress Notes (Signed)
US abdomen negative. No hepatobiliary or other significant abnormality. 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

## 2019-06-08 LAB — CBC/DIFF AMBIGUOUS DEFAULT
Basophils Absolute: 0.1 10*3/uL (ref 0.0–0.2)
Basos: 1 %
EOS (ABSOLUTE): 0.1 10*3/uL (ref 0.0–0.4)
Eos: 2 %
Hematocrit: 51.6 % — ABNORMAL HIGH (ref 37.5–51.0)
Hemoglobin: 17.5 g/dL (ref 13.0–17.7)
Immature Grans (Abs): 0 10*3/uL (ref 0.0–0.1)
Immature Granulocytes: 0 %
Lymphocytes Absolute: 2.3 10*3/uL (ref 0.7–3.1)
Lymphs: 42 %
MCH: 31.2 pg (ref 26.6–33.0)
MCHC: 33.9 g/dL (ref 31.5–35.7)
MCV: 92 fL (ref 79–97)
Monocytes Absolute: 0.6 10*3/uL (ref 0.1–0.9)
Monocytes: 10 %
Neutrophils Absolute: 2.5 10*3/uL (ref 1.4–7.0)
Neutrophils: 45 %
Platelets: 196 10*3/uL (ref 150–450)
RBC: 5.61 x10E6/uL (ref 4.14–5.80)
RDW: 11.5 % — ABNORMAL LOW (ref 11.6–15.4)
WBC: 5.5 10*3/uL (ref 3.4–10.8)

## 2019-06-08 LAB — BASIC METABOLIC PANEL
BUN/Creatinine Ratio: 11 (ref 10–24)
BUN: 11 mg/dL (ref 8–27)
CO2: 22 mmol/L (ref 20–29)
Calcium: 9.5 mg/dL (ref 8.6–10.2)
Chloride: 100 mmol/L (ref 96–106)
Creatinine, Ser: 1.04 mg/dL (ref 0.76–1.27)
GFR calc Af Amer: 87 mL/min/{1.73_m2} (ref >59)
GFR calc non Af Amer: 75 mL/min/{1.73_m2} (ref >59)
Glucose: 83 mg/dL (ref 65–99)
Potassium: 4.6 mmol/L (ref 3.5–5.2)
Sodium: 138 mmol/L (ref 134–144)

## 2019-06-08 LAB — HEPATIC FUNCTION PANEL
ALT: 29 IU/L (ref 0–44)
AST: 36 IU/L (ref 0–40)
Albumin: 4.2 g/dL (ref 3.8–4.8)
Alkaline Phosphatase: 86 IU/L (ref 39–117)
Bilirubin Total: 1.1 mg/dL (ref 0.0–1.2)
Bilirubin, Direct: 0.43 mg/dL — ABNORMAL HIGH (ref 0.00–0.40)
Total Protein: 8.5 g/dL (ref 6.0–8.5)

## 2019-06-08 LAB — PROTIME-INR
INR: 1 (ref 0.8–1.2)
Prothrombin Time: 10.9 s (ref 9.1–12.0)

## 2019-06-08 LAB — HEPATITIS A ANTIBODY, TOTAL: hep A Total Ab: POSITIVE — AB

## 2019-06-08 LAB — HEPATITIS C GENOTYPE

## 2019-06-08 LAB — SPECIMEN STATUS REPORT

## 2019-06-08 LAB — HIV ANTIBODY (ROUTINE TESTING W REFLEX): HIV Screen 4th Generation wRfx: NONREACTIVE

## 2019-06-10 ENCOUNTER — Telehealth: Payer: Self-pay | Admitting: Gastroenterology

## 2019-06-10 NOTE — Telephone Encounter (Signed)
Lance Bautista, can you let patient/facility know:  Patients labs are largely normal. Hepatitis A Ab positive. This is likely due to immunity by natural infection as patient is asymptomatic at this time.  We will need to submit for Hepatitis C treatment. Hep C genotype 1a. Plans to pursue Harvoni for 8 weeks.  Patient will need office visit with a staff member accompanying him once we get the medication approved and have it here at the office. We will go over the medication at that time and what is expected over the next several weeks.   **Patient also needs Hep B vaccination. Prescription has been written. This needs to be picked up for patient to have completed.   Lance Bautista, can you get the process going for [facility] prior authorization?

## 2019-06-10 NOTE — Progress Notes (Signed)
CBC essentially normal. Hemoglobin 17.5, platelets 196. BMP normal. HFP essentially normal. Direct bilirubin minimally elevated at .0.43, total bilirubin wnl at 1.1.  INR 1.0. HIV Ab non reactive. Hepatitis A Ab positive. This is likely due to immunity by natural infection. Patient is asymptomatic at this time.  Hepatitis C genotype 1a.   We will need to submit for treatment. Plans to pursue Harvoni for 8 weeks due to Hep C RNA < 6 million. Patient will need office visit with a staff member accompanying him once we get the medication approved and have it here at the office.  Patient will also need Hep C RNA Quantitative, CMP, and CBC 4 weeks after treatment initiation and 8 weeks after treatment initiation/completion of therapy. Will check Hep C viral load 12 weeks after completion of treatment.   Patient will need office visit 8 weeks after treatment initiation.  Patient also needs Hep B vaccination. Prescription has been written. This needs to be picked up for patient to have completed.

## 2019-06-14 NOTE — Telephone Encounter (Signed)
Spoke with Levie Heritage, she asked if I could mail pt's Hep B vaccination order. Order mailed. Will work on [employer] for pt.

## 2019-06-18 NOTE — Telephone Encounter (Signed)
Spoke to [employer], [employer] cards aren't in pts chart. She doesn't have copies of the insurance but will speak with pts POA. Insurance info is needed to submit for Hep C treatment.

## 2019-07-07 DIAGNOSIS — Z6832 Body mass index (BMI) 32.0-32.9, adult: Secondary | ICD-10-CM | POA: Diagnosis not present

## 2019-07-07 DIAGNOSIS — B182 Chronic viral hepatitis C: Secondary | ICD-10-CM | POA: Diagnosis not present

## 2019-07-07 DIAGNOSIS — F17200 Nicotine dependence, unspecified, uncomplicated: Secondary | ICD-10-CM | POA: Diagnosis not present

## 2019-07-07 DIAGNOSIS — Z7182 Exercise counseling: Secondary | ICD-10-CM | POA: Diagnosis not present

## 2019-07-07 DIAGNOSIS — F209 Schizophrenia, unspecified: Secondary | ICD-10-CM | POA: Diagnosis not present

## 2019-07-13 ENCOUNTER — Telehealth: Payer: Self-pay | Admitting: Gastroenterology

## 2019-07-13 NOTE — Telephone Encounter (Signed)
Spoke to [employer] that She doesn't have pt's insurance information. Levie Heritage gave me the contact info for Mrs. Enock [phone]) which is a case worker that could help with pt's insurance info. Mrs. Boykin Reaper says Levie Heritage should have pt's insurance information. She is going to call me back with the information soon.   Pt's appointment was cancelled.

## 2019-07-13 NOTE — Telephone Encounter (Signed)
Spoke with Mrs. Enock. She is working away from the office and she will be able to fax a copy of pt's insurance info at the end of the week.

## 2019-07-13 NOTE — Telephone Encounter (Signed)
Lance Bautista, patient is scheduled for office visit tomorrow. This was supposed to be to discuss treatment for hepatitis C; however, we have not submitted for treatment as far as I can tell. The last communication was about needing to obtain patients insurance information. We need to cancel patients appointment (unless he has new concerns) and plan to follow up once we are able to submit for treatment. Can we also follow-up on patients insurance?

## 2019-07-14 ENCOUNTER — Ambulatory Visit: Payer: Medicare Other | Admitting: Gastroenterology

## 2019-08-05 DIAGNOSIS — B182 Chronic viral hepatitis C: Secondary | ICD-10-CM | POA: Diagnosis not present

## 2019-08-05 DIAGNOSIS — F209 Schizophrenia, unspecified: Secondary | ICD-10-CM | POA: Diagnosis not present

## 2019-08-12 DIAGNOSIS — Z23 Encounter for immunization: Secondary | ICD-10-CM | POA: Diagnosis not present

## 2019-09-03 DIAGNOSIS — F209 Schizophrenia, unspecified: Secondary | ICD-10-CM | POA: Diagnosis not present

## 2019-09-03 DIAGNOSIS — B182 Chronic viral hepatitis C: Secondary | ICD-10-CM | POA: Diagnosis not present

## 2019-09-06 DIAGNOSIS — F29 Unspecified psychosis not due to a substance or known physiological condition: Secondary | ICD-10-CM | POA: Diagnosis not present

## 2019-09-27 NOTE — Telephone Encounter (Signed)
Can we reach back out regarding patients insurance? We really need to submit for Hep C treatment.

## 2019-09-28 NOTE — Telephone Encounter (Signed)
Insurance info was received. Papers filled our and are ready to be signed. Placed in [facility] box.

## 2019-09-28 NOTE — Telephone Encounter (Signed)
Spoke with Mrs. Enock, she is faxing over the information for pt now. She said when she got ready to fax the information the last time, she couldn't find our info. Mrs. Boykin Reaper reached out to [facility] and was told she didn't have our info.

## 2019-09-29 NOTE — Telephone Encounter (Signed)
Papers filled out. We will submit for Harvoni for 12 weeks rather than 8 weeks as patient is African American and this is one of the indications for 12 rather than 8 week therapy. Patient will need office visit with a staff member accompanying him once we receive the medication here at the office.    Papers placed back on [employer].

## 2019-09-30 NOTE — Telephone Encounter (Signed)
Noted. Paperwork faxed to [facility] this morning 09/30/2019. Waiting to hear from [place].

## 2019-10-28 NOTE — Telephone Encounter (Signed)
Lance Bautista, can we follow-up on Harvoni approval?

## 2019-10-29 NOTE — Telephone Encounter (Signed)
We received an approval letter through [facility] in 10/02/2019. [facility] has been working on getting a form from the pts facility. They are almost ready to schedule delivery to our office soon. They are going to contact our office back.

## 2019-10-29 NOTE — Telephone Encounter (Signed)
Noted  

## 2019-11-16 NOTE — Telephone Encounter (Signed)
Noted  

## 2019-11-16 NOTE — Telephone Encounter (Addendum)
Reached out to [place] and they have tried to contact pt. Lance Bautista hasn't returned their call.   I spoke with Lance Bautista and she is going to call [facility] today.

## 2019-12-06 ENCOUNTER — Telehealth: Payer: Self-pay

## 2019-12-06 NOTE — Telephone Encounter (Signed)
Received a Vm from [facility] in reference to the meds for this pt.  They are wanting to know when to deliver.  Forwarding to [place] who is taking care of this pt.  Their call back number is [phone] opt 2 .

## 2019-12-06 NOTE — Telephone Encounter (Signed)
Returned call to [facility]. Hep C medication is scheduled for delivery on Wednesday 12/08/19.

## 2019-12-10 NOTE — Telephone Encounter (Signed)
Hep C medication arrived in office. Please advise.

## 2019-12-10 NOTE — Telephone Encounter (Signed)
Patient as well as a Corporate treasurer need to come in for a nurse visit to sign necessary paperwork, pick up medications, and go over instructions.   Instructions for Harvoni: Take Harvoni every day at the same time with or without food.  Do not miss or skip doses. 2.  Do not start any new prescriptive or nonprescriptive medications including over-the-counter supplements or herbal products without checking with [place] first. 3.  Follow-up 4 weeks after initiation of Harvoni for labs.  4.  They should call if he has any bothersome side effects from Harvoni.  Side effects include fatigue, headache, nausea, diarrhea, and insomnia.

## 2019-12-13 NOTE — Telephone Encounter (Signed)
Noted. Spoke with Oneal Grout. Per Oneal Grout, she will stop by tomorrow 12/14/2019 to pick up pts medication.

## 2019-12-23 NOTE — Telephone Encounter (Signed)
Extended PA approval wad received today for Harvoni. Approval is good through  03/20/2020

## 2019-12-31 NOTE — Telephone Encounter (Signed)
Stacey please schedule 4 week apt for hep c f/u. Pt picked medication up on 12/14/2019 and didn't make a f/u for 4 weeks with [facility].

## 2019-12-31 NOTE — Telephone Encounter (Signed)
Has patient/caregiver come to pick up Harvoni?

## 2019-12-31 NOTE — Telephone Encounter (Signed)
Noted  

## 2019-12-31 NOTE — Telephone Encounter (Signed)
Yes, I scanned signature and signed documents by Oneal Grout at the home of pt under media in pts chart. Medication was picked up on 12/14/2019.

## 2020-01-03 ENCOUNTER — Encounter: Payer: Self-pay | Admitting: Internal Medicine

## 2020-01-03 NOTE — Telephone Encounter (Signed)
Noted  

## 2020-01-03 NOTE — Telephone Encounter (Signed)
PATIENT SCHEDULED  °

## 2020-01-19 NOTE — Telephone Encounter (Signed)
Raven from [facility] called to schedule delivery for pts Hep C medication. Medication  Will be delivered Tuesday 01/25/2020.

## 2020-01-26 NOTE — Telephone Encounter (Signed)
Noted  

## 2020-01-26 NOTE — Telephone Encounter (Signed)
FYI Pts Hep C medication has arrived. Pt will pick medication up at his apt on 03/02/2020.

## 2020-01-26 NOTE — Telephone Encounter (Signed)
Medication was picked up on 12/14/2019. The home [facility]) where pt resides stated that pt has pills left to make it until his apt on Monday. The starting date is unclear. Medication was probably not stated on the date of 12/14/2019 when [facility] picked medication up.

## 2020-01-26 NOTE — Telephone Encounter (Signed)
When did he start taking the medication? Looks like it was picked up on 12/14/19. Has he missed any doses? If he is going to miss doses waiting until his upcoming appointment on 3/15, the medication needs to be picked up prior to that visit.

## 2020-01-28 ENCOUNTER — Telehealth: Payer: Self-pay | Admitting: Internal Medicine

## 2020-01-28 NOTE — Telephone Encounter (Signed)
Spoke with [employer]. Pt will come in office Monday at 2 pm and his Hep C medication is ready for pickup.

## 2020-01-28 NOTE — Telephone Encounter (Signed)
[phone] PLEASE CALL BEVERLY RUCKER REGARDING THIS PATIENT HEP MEDICATION

## 2020-01-30 NOTE — Progress Notes (Signed)
Referring Provider: Rosita Fire, MD Primary Care Physician:  Rosita Fire, MD Primary GI Physician: Dr. Gala Romney  Chief Complaint  Patient presents with  . Hepatitis C    HPI:   Lance Bautista is a 64 y.o. male presenting today for follow-up of Hepatitis C, genotype 1a, RNA 1,580,000. He was seen in our office for initial consult on 05/31/2019.  Reported history of incarceration, homelessness, and tattoos in unregulated settings.  No history of IV or intranasal drug use.  History of alcohol use but none currently.  He had no significant upper or lower GI symptoms.  No signs or symptoms of advanced liver disease.  LFTs within normal limits in April 2020.  Hepatitis B negative without immunity.  Plans to obtain pretreatment labs, ultrasound abdomen complete with elastography, and hopefully submit for treatment soon.  Labs completed on 06/03/2019.  Hepatitis C genotype 1a.  CBC essentially normal, HFP essentially normal, BMP within normal limits, INR normal.  HIV nonreactive.  Hepatitis A antibody positive consistent with immunity. Ultrasound with elastography completed on 06/07/2019.  Liver appeared within normal limits.  No hepatobiliary or other significant abnormality.  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  Hepatitis B vaccine mailed to [facility].  Submitted for Harvoni x12 weeks.  Medication was picked up from our office on 12/14/2019.  It is unclear exactly when patient started this medication.  We received his second medication delivery on 01/25/2020.  Patient is to pick up today at his office visit.  Today:  Started [name] on: 01/03/20. Last dose of first initial 4 weeks will be 01/22/20 or 02/02/20. (Information received from [facility]).   No concerns today. No fatigue, headaches, nausea, vomiting, diarrhea, or insomnia. No abdominal pain, GERD symptoms, dysphagia. BMs daily. No constipation. No blood in the stool or black stools.   No yellowing of eyes.  No swelling in abdomen or LE. No confusion. Urine is pale yellow to clear.   Unclear whether or not hepatitis B vaccination has been completed.  He is aware not to share any personal equipment or allow anyone to come into contact with his blood.    Past Medical History:  Diagnosis Date  . Hepatitis C   . Schizophrenia ([place])     History reviewed. No pertinent surgical history.  Current Outpatient Medications  Medication Sig Dispense Refill  . chlorhexidine (PERIDEX) 0.12 % solution Use as directed 15 mLs in the mouth or throat 2 (two) times daily.    Marland Kitchen OLANZapine (ZYPREXA) 5 MG tablet Take 5 mg by mouth 2 (two) times daily.      No current facility-administered medications for this visit.    Allergies as of 01/31/2020  . (No Known Allergies)    Family History  Problem Relation Age of Onset  . Colon cancer Neg Hx     Social History   Socioeconomic History  . Marital status: Single    Spouse name: Not on file  . Number of children: Not on file  . Years of education: Not on file  . Highest education level: Not on file  Occupational History  . Not on file  Tobacco Use  . Smoking status: Current Every Day Smoker    Packs/day: 1.00    Years: 10.00    Pack years: 10.00  . Smokeless tobacco: Never Used  . Tobacco comment: one pack per day  Substance and Sexual Activity  . Alcohol use: Not Currently  . Drug use: Not Currently  .  Sexual activity: Not on file  Other Topics Concern  . Not on file  Social History Narrative  . Not on file   Social Determinants of Health   Financial Resource Strain:   . Difficulty of Paying Living Expenses:   Food Insecurity:   . Worried About Programme researcher, broadcasting/film/video in the Last Year:   . Barista in the Last Year:   Transportation Needs:   . Freight forwarder (Medical):   Marland Kitchen Lack of Transportation (Non-Medical):   Physical Activity:   . Days of Exercise per Week:   . Minutes of Exercise per Session:   Stress:   .  Feeling of Stress :   Social Connections:   . Frequency of Communication with Friends and Family:   . Frequency of Social Gatherings with Friends and Family:   . Attends Religious Services:   . Active Member of Clubs or Organizations:   . Attends Banker Meetings:   Marland Kitchen Marital Status:     Review of Systems: Gen: Denies fever, chills, lightheadedness, dizziness, presyncope, syncope, unintentional weight loss. CV: Denies chest pain or palpitations. Resp: Denies shortness of breath or cough. GI: See HPI Derm: Denies rash Psych: Denies depression or anxiety Heme: Denies bruising or bleeding  Physical Exam: BP (!) 131/93   Pulse 83   Temp (!) 96.9 F (36.1 C) (Temporal)   Ht 5\' 6"  (1.676 m)   Wt 199 lb 6.4 oz (90.4 kg)   BMI 32.18 kg/m  General:   Alert and oriented. No distress noted. Pleasant and cooperative.  Head:  Normocephalic and atraumatic. Eyes:  Conjuctiva clear without scleral icterus. Heart:  S1, S2 present without murmurs appreciated. Lungs:  Clear to auscultation bilaterally. No wheezes, rales, or rhonchi. No distress.  Abdomen:  +BS, soft, non-tender and non-distended. No rebound or guarding. No HSM or masses noted. Msk:  Symmetrical without gross deformities. Normal posture. Extremities:  Without edema. Neurologic:  Alert and  oriented x4 Psych: Normal mood and affect.

## 2020-01-31 ENCOUNTER — Ambulatory Visit (INDEPENDENT_AMBULATORY_CARE_PROVIDER_SITE_OTHER): Payer: Medicare Other | Admitting: Gastroenterology

## 2020-01-31 ENCOUNTER — Other Ambulatory Visit: Payer: Self-pay

## 2020-01-31 ENCOUNTER — Encounter: Payer: Self-pay | Admitting: Gastroenterology

## 2020-01-31 VITALS — BP 131/93 | HR 83 | Temp 96.9°F | Ht 66.0 in | Wt 199.4 lb

## 2020-01-31 DIAGNOSIS — B182 Chronic viral hepatitis C: Secondary | ICD-10-CM | POA: Diagnosis not present

## 2020-01-31 NOTE — Assessment & Plan Note (Addendum)
64 year old male with past medical history of schizophrenia who was newly diagnosed with hepatitis C in February 2020 with HCVRNA quantitative 1,580,000.  Genotype 1a.  Ultrasound on 06/07/2019 with normal-appearing liver.  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  LFTs, INR, platelets, and hemoglobin within normal limits.  Risk factors for hepatitis C include incarceration, homelessness in the past, and tattoos in unregulated settings.  No history of IV or intranasal drug use.  History of alcohol use, but none at this time.  He started Harvoni on 01/03/2020 and is currently doing well. Plans for 12 weeks of treatment.  No side effects.  No signs or symptoms of advanced liver disease.  He has immunity to hepatitis A.  No prior hepatitis B but no immunity.  Orders were mailed for hepatitis B vaccine to [facility] but unclear whether or not this has been completed.  We will need to follow-up on this.  If series has not been started, would prefer waiting until hepatitis C treatment is completed.  He is picking up his second shipment of Harvoni today and is due for updated labs.   Update CBC, CMP, INR, HCV RNA quantitative. Continue Harvoni at the same time every day.  Do not miss doses. Do not start any new prescriptive or nonprescriptive medications including over-the-counter supplements or herbal products without checking with [place] first. Continue to monitor for side effects to Harvoni.  He will pick up the third and final shipment of Harvoni in our office in about 4 weeks. We will follow-up with him in the office in about 8 weeks which should be at the end of treatment.  Advised to call with questions or concerns prior.

## 2020-01-31 NOTE — Patient Instructions (Addendum)
Please have labs completed.  Continue taking Harvoni at the same time every day.  It is very important that you do not miss doses.  Do not start any new prescriptive or nonprescriptive medications including over-the-counter supplements or herbal products without checking with [place] first.  You should be picking up your second shipment of Harvoni today.   You will pick up your third and last shipment of Harvoni in our office as well in about 4 weeks.   Continue to monitor for potential side effects of Harvoni including fatigue, headache, nausea, diarrhea, and trouble sleeping.  Let us know if these occur.  We will plan to see you back in 8 weeks. Call with questions or concerns prior.   Ermalinda Memos, PA-C [facility]

## 2020-02-01 NOTE — Progress Notes (Signed)
Cc'ed to pcp °

## 2020-02-13 LAB — CMP14+EGFR
ALT: 8 IU/L (ref 0–44)
AST: 22 IU/L (ref 0–40)
Albumin/Globulin Ratio: 1 — ABNORMAL LOW (ref 1.2–2.2)
Albumin: 4.1 g/dL (ref 3.8–4.8)
Alkaline Phosphatase: 76 IU/L (ref 39–117)
BUN/Creatinine Ratio: 9 — ABNORMAL LOW (ref 10–24)
BUN: 10 mg/dL (ref 8–27)
Bilirubin Total: 0.7 mg/dL (ref 0.0–1.2)
CO2: 20 mmol/L (ref 20–29)
Calcium: 9.8 mg/dL (ref 8.6–10.2)
Chloride: 106 mmol/L (ref 96–106)
Creatinine, Ser: 1.09 mg/dL (ref 0.76–1.27)
GFR calc Af Amer: 81 mL/min/{1.73_m2} (ref >59)
GFR calc non Af Amer: 70 mL/min/{1.73_m2} (ref >59)
Globulin, Total: 4.2 g/dL (ref 1.5–4.5)
Glucose: 88 mg/dL (ref 65–99)
Potassium: 4.2 mmol/L (ref 3.5–5.2)
Sodium: 144 mmol/L (ref 134–144)
Total Protein: 8.3 g/dL (ref 6.0–8.5)

## 2020-02-13 LAB — HCV RNA QUANT
HCV log10: 1.301 log10 IU/mL
Hepatitis C Quantitation: 20 IU/mL

## 2020-02-13 LAB — CBC WITH DIFFERENTIAL/PLATELET
Basophils Absolute: 0 10*3/uL (ref 0.0–0.2)
Basos: 1 %
EOS (ABSOLUTE): 0.1 10*3/uL (ref 0.0–0.4)
Eos: 2 %
Hematocrit: 49.5 % (ref 37.5–51.0)
Hemoglobin: 16.9 g/dL (ref 13.0–17.7)
Immature Grans (Abs): 0 10*3/uL (ref 0.0–0.1)
Immature Granulocytes: 0 %
Lymphocytes Absolute: 3.2 10*3/uL — ABNORMAL HIGH (ref 0.7–3.1)
Lymphs: 50 %
MCH: 32.3 pg (ref 26.6–33.0)
MCHC: 34.1 g/dL (ref 31.5–35.7)
MCV: 95 fL (ref 79–97)
Monocytes Absolute: 0.7 10*3/uL (ref 0.1–0.9)
Monocytes: 10 %
Neutrophils Absolute: 2.4 10*3/uL (ref 1.4–7.0)
Neutrophils: 37 %
Platelets: 193 10*3/uL (ref 150–450)
RBC: 5.23 x10E6/uL (ref 4.14–5.80)
RDW: 11.4 % — ABNORMAL LOW (ref 11.6–15.4)
WBC: 6.3 10*3/uL (ref 3.4–10.8)

## 2020-02-13 LAB — PROTIME-INR
INR: 1 (ref 0.9–1.2)
Prothrombin Time: 10.8 s (ref 9.1–12.0)

## 2020-02-13 NOTE — Progress Notes (Signed)
Overall labs look great. Hepatitis C RNA has decreased significantly from 1,580,000 to 20. This means Harvoni is working. He should continue taking Harvoni daily at the same time and ensure not to miss a dose. He picked up his second shipment of Harvoni at his office visit on 3/15. He should have one more shipment of medication. We will plan to follow-up as scheduled.

## 2020-02-22 NOTE — Telephone Encounter (Signed)
Spoke with Lance Bautista, pt is scheduled for a Hep C delivery within the next week. [facility] has been trying to confirm delivery with pt. The contact phone number was given for [facility]. Lance Bautista will return their call and they will call our office back with a delivery date.

## 2020-02-29 ENCOUNTER — Telehealth: Payer: Self-pay

## 2020-02-29 NOTE — Telephone Encounter (Signed)
FYI-Todd from [facility] called- he is trying to get the pts medication medication ready before his PA expires. He stated he was going to ship the pt medication to [place] tomorrow.

## 2020-03-01 NOTE — Telephone Encounter (Signed)
Noted  

## 2020-03-01 NOTE — Telephone Encounter (Signed)
Hep C medication arrived. Caregiver will pick medication up.

## 2020-04-02 NOTE — Progress Notes (Signed)
Referring Provider: Rosita Fire, MD Primary Care Physician:  Rosita Fire, MD Primary GI Physician: Dr. Gala Romney  Chief Complaint  Patient presents with  . Hepatitis C    HPI:   Lance Bautista is a 64 y.o. male presenting today for follow-up of Hepatitis C, genotype 1a, RNA 1,580,000. He was seen in our office for initial consult on 05/31/2019.  Reported history of incarceration, homelessness, and tattoos in unregulated settings.  No history of IV or intranasal drug use.  History of alcohol use but none currently. HIV negative. Immune to Hep A. No immunity to Hep B (Rx was mailed to [employer] [employer]). Slight elevation of AST (54) and total bilirubin (1.3) in January but normalized in July 2020. INR and platelets within normal limits. Ultrasound with normal appearing liver but elastography corresponding to F2 and some F3. Started Harvoni 01/03/20 and should have completed his 12 week course on 03/27/20.   Last seen in our office on 01/31/20 following initial 4 weeks of therapy. He was doing well with no medication side effects. No signs or symptoms of advanced liver disease. Labs were updated. Hep C RNA down to 20. LFTs, electrolytes, kidney function, hemoglobin, platelets, and INR within normal limits. Plans to follow up at the end of treatment.   Today:  Care giver states medication was completed on 03/27/20. Needing a DC order for Harvoni. No GI concerns.  Patient denies abdominal pain, constipation, diarrhea, bright red blood per rectum, melena, nausea, vomiting, GERD symptoms, or dysphagia.  Denies changes in mental status, swelling in abdomen or lower extremities, yellowing of the eyes, or easy bruising/bleeding.  Unclear if Hep B vaccination has been completed.   Past Medical History:  Diagnosis Date  . Hepatitis C   . Schizophrenia ([place])     History reviewed. No pertinent surgical history.  Current Outpatient Medications  Medication Sig Dispense Refill  . chlorhexidine (PERIDEX)  0.12 % solution Use as directed 15 mLs in the mouth or throat 2 (two) times daily.    Marland Kitchen OLANZapine (ZYPREXA) 5 MG tablet Take 5 mg by mouth 2 (two) times daily.      No current facility-administered medications for this visit.    Allergies as of 04/03/2020  . (No Known Allergies)    Family History  Problem Relation Age of Onset  . Colon cancer Neg Hx     Social History   Socioeconomic History  . Marital status: Single    Spouse name: Not on file  . Number of children: Not on file  . Years of education: Not on file  . Highest education level: Not on file  Occupational History  . Not on file  Tobacco Use  . Smoking status: Current Every Day Smoker    Packs/day: 1.00    Years: 10.00    Pack years: 10.00  . Smokeless tobacco: Never Used  . Tobacco comment: one pack per day  Substance and Sexual Activity  . Alcohol use: Not Currently  . Drug use: Not Currently  . Sexual activity: Not on file  Other Topics Concern  . Not on file  Social History Narrative  . Not on file   Social Determinants of Health   Financial Resource Strain:   . Difficulty of Paying Living Expenses:   Food Insecurity:   . Worried About Charity fundraiser in the Last Year:   . Arboriculturist in the Last Year:   Transportation Needs:   . Lack of Transportation (  Medical):   Marland Kitchen Lack of Transportation (Non-Medical):   Physical Activity:   . Days of Exercise per Week:   . Minutes of Exercise per Session:   Stress:   . Feeling of Stress :   Social Connections:   . Frequency of Communication with Friends and Family:   . Frequency of Social Gatherings with Friends and Family:   . Attends Religious Services:   . Active Member of Clubs or Organizations:   . Attends Banker Meetings:   Marland Kitchen Marital Status:     Review of Systems: Gen: Denies fever, chills, cold or flulike symptoms, lightheadedness, dizziness, presyncope, syncope. CV: Denies chest pain or palpitations Resp: Denies  shortness of breath or cough. GI: See HPI Derm: Denies rash Heme: See HPI  Physical Exam: BP 128/89   Pulse 70   Temp (!) 97.5 F (36.4 C) (Temporal)   Ht 5\' 6"  (1.676 m)   Wt 201 lb 3.2 oz (91.3 kg)   BMI 32.47 kg/m  General:   Alert and oriented. No distress noted. Pleasant and cooperative.  Head:  Normocephalic and atraumatic. Eyes:  Conjuctiva clear without scleral icterus. Heart:  S1, S2 present without murmurs appreciated. Lungs:  Clear to auscultation bilaterally. No wheezes, rales, or rhonchi. No distress.  Abdomen:  +BS, soft, non-tender and non-distended. No rebound or guarding. No HSM or masses noted. Msk:  Symmetrical without gross deformities. Normal posture. Extremities:  Without edema. Neurologic:  Alert and  oriented x4 Psych: Normal mood and affect.

## 2020-04-03 ENCOUNTER — Other Ambulatory Visit: Payer: Self-pay

## 2020-04-03 ENCOUNTER — Encounter: Payer: Self-pay | Admitting: Gastroenterology

## 2020-04-03 ENCOUNTER — Ambulatory Visit (INDEPENDENT_AMBULATORY_CARE_PROVIDER_SITE_OTHER): Payer: Medicare Other | Admitting: Gastroenterology

## 2020-04-03 ENCOUNTER — Telehealth: Payer: Self-pay | Admitting: Gastroenterology

## 2020-04-03 VITALS — BP 128/89 | HR 70 | Temp 97.5°F | Ht 66.0 in | Wt 201.2 lb

## 2020-04-03 DIAGNOSIS — B1920 Unspecified viral hepatitis C without hepatic coma: Secondary | ICD-10-CM

## 2020-04-03 NOTE — Patient Instructions (Addendum)
You have completed your Harvoni treatment.  We will check to make sure that hepatitis C has been eradicated.  Please have labs completed.  We will call you with your results.  Ermalinda Memos, PA-C [facility]

## 2020-04-03 NOTE — Telephone Encounter (Signed)
Lm for [employer]. Waiting on a return call.

## 2020-04-03 NOTE — Progress Notes (Signed)
Cc'ed to pcp °

## 2020-04-03 NOTE — Telephone Encounter (Signed)
Lance Bautista, can we reach out to Leslie Ruckers to see if patient has started Hep B vaccination yet? We had mailed the Rx several months ago (July 2020). I forgot to ask the caregiver today if they knew anything about this.

## 2020-04-03 NOTE — Assessment & Plan Note (Addendum)
64 year old male with past medical history of schizophrenia diagnosed with hepatitis C in February 2020 with HCVRNA 1,580,000, genotype 1a.  Ultrasound with normal-appearing liver.  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  Risk factors for hep C included incarceration, homelessness in the past, and tattoos in unregulated settings.  No IV or intranasal drug use.  No current alcohol use.  Immune to Hep A. No immunity to Hep B. We mailed Hep B Rx to [facility] but not clear if vaccination has been completed. He was started on Harvoni on 01/03/2020 and has now completed a 12-week course without missed doses.  Last HCVRNA check in March 2021 with HCVRNA down to 20. LFTs, platelets, INR, kidney function within normal limits. He is without signs or symptoms of advanced/decompensated liver disease.   Update HCVRNA quantitative, CBC, CMP, and INR as treatment has now been completed. If HCVRNA quantitative is not detected, plan for rechecking viral load in 12 weeks. He will also need ultrasound with elastography in 1 year to reevaluate fibrosis score and determine whether or not he needs ongoing surveillance. As he is currently living at [facility] and denies any drug use, he is low risk for reinfection.  Will need to reach out to [facility] to see if patient has completed Hep B vaccination.  Follow-up date to be determined pending lab results.

## 2020-04-06 NOTE — Telephone Encounter (Signed)
Lance Bautista returned call. Pt hasn't had his Hep B vaccination. Mailed script/order to [facility] today.

## 2020-04-06 NOTE — Telephone Encounter (Signed)
Noted! Thank you

## 2020-06-15 NOTE — Progress Notes (Signed)
Overall, labs look great.  WBC, hemoglobin, platelets, electrolytes, kidney function, LFTs all within normal limits.  INR within normal limits.  We are waiting on HCVRNA to result.

## 2020-06-17 LAB — COMPLETE METABOLIC PANEL WITH GFR
AG Ratio: 1.1 (calc) (ref 1.0–2.5)
ALT: 18 U/L (ref 9–46)
AST: 30 U/L (ref 10–35)
Albumin: 3.9 g/dL (ref 3.6–5.1)
Alkaline phosphatase (APISO): 75 U/L (ref 35–144)
BUN: 13 mg/dL (ref 7–25)
CO2: 30 mmol/L (ref 20–32)
Calcium: 9.4 mg/dL (ref 8.6–10.3)
Chloride: 105 mmol/L (ref 98–110)
Creat: 0.89 mg/dL (ref 0.70–1.25)
GFR, Est African American: 103 mL/min/{1.73_m2} (ref >=60)
GFR, Est Non African American: 89 mL/min/{1.73_m2} (ref >=60)
Globulin: 3.6 g/dL (calc) (ref 1.9–3.7)
Glucose, Bld: 82 mg/dL (ref 65–99)
Potassium: 4.3 mmol/L (ref 3.5–5.3)
Sodium: 141 mmol/L (ref 135–146)
Total Bilirubin: 1.1 mg/dL (ref 0.2–1.2)
Total Protein: 7.5 g/dL (ref 6.1–8.1)

## 2020-06-17 LAB — HEPATITIS C RNA QUANTITATIVE
HCV Quantitative Log: 6.55 Log IU/mL — ABNORMAL HIGH
HCV RNA, PCR, QN: 3520000 IU/mL — ABNORMAL HIGH

## 2020-06-17 LAB — PROTIME-INR
INR: 1
Prothrombin Time: 10.4 s (ref 9.0–11.5)

## 2020-06-17 LAB — CBC WITH DIFFERENTIAL/PLATELET
Absolute Monocytes: 567 cells/uL (ref 200–950)
Basophils Absolute: 42 cells/uL (ref 0–200)
Basophils Relative: 0.8 %
Eosinophils Absolute: 151 cells/uL (ref 15–500)
Eosinophils Relative: 2.9 %
HCT: 47.8 % (ref 38.5–50.0)
Hemoglobin: 16.2 g/dL (ref 13.2–17.1)
Lymphs Abs: 2454 cells/uL (ref 850–3900)
MCH: 32.6 pg (ref 27.0–33.0)
MCHC: 33.9 g/dL (ref 32.0–36.0)
MCV: 96.2 fL (ref 80.0–100.0)
MPV: 10.8 fL (ref 7.5–12.5)
Monocytes Relative: 10.9 %
Neutro Abs: 1986 cells/uL (ref 1500–7800)
Neutrophils Relative %: 38.2 %
Platelets: 207 10*3/uL (ref 140–400)
RBC: 4.97 10*6/uL (ref 4.20–5.80)
RDW: 11.3 % (ref 11.0–15.0)
Total Lymphocyte: 47.2 %
WBC: 5.2 10*3/uL (ref 3.8–10.8)

## 2020-06-22 ENCOUNTER — Other Ambulatory Visit: Payer: Self-pay

## 2020-06-22 DIAGNOSIS — Z8619 Personal history of other infectious and parasitic diseases: Secondary | ICD-10-CM

## 2020-06-29 ENCOUNTER — Telehealth: Payer: Self-pay | Admitting: Internal Medicine

## 2020-06-29 NOTE — Telephone Encounter (Signed)
Please call Mrs Wyline Mood about patient's lab order. She said she was at [facility] lab now and they are telling her the blood work has already been done. [phone]

## 2020-06-29 NOTE — Telephone Encounter (Signed)
Lmom, waiting on a return call.  

## 2020-07-04 LAB — HEPATITIS C GENOTYPE

## 2020-07-13 ENCOUNTER — Other Ambulatory Visit: Payer: Self-pay

## 2020-07-13 DIAGNOSIS — B182 Chronic viral hepatitis C: Secondary | ICD-10-CM

## 2020-07-17 ENCOUNTER — Other Ambulatory Visit: Payer: Self-pay

## 2020-07-17 DIAGNOSIS — Z8619 Personal history of other infectious and parasitic diseases: Secondary | ICD-10-CM

## 2020-07-19 ENCOUNTER — Other Ambulatory Visit: Payer: Self-pay | Admitting: Nurse Practitioner

## 2020-07-19 DIAGNOSIS — B182 Chronic viral hepatitis C: Secondary | ICD-10-CM

## 2020-07-19 NOTE — Progress Notes (Signed)
Order entered for HCV RNA to check SVR-12 "cure"  Will likely need to be printed and faxed to facility

## 2020-07-19 NOTE — Progress Notes (Signed)
Noted. Spoke with One[facility] Grout and faxed lab order [phone].

## 2020-07-26 ENCOUNTER — Ambulatory Visit (HOSPITAL_COMMUNITY): Payer: Medicare Other

## 2020-08-05 IMAGING — US US ABDOMEN COMPLETE WITH ELASTOGRAPHY
1 series · 13 of 19 positions shown · non-contrast
Comparison: None.

CLINICAL DATA: Hepatitis C virus infection without hepatic coma.



[Series 1: us abdomen complete with elastography · 0.12mm/px · 13 of 19 slices shown]
[im 1/19]
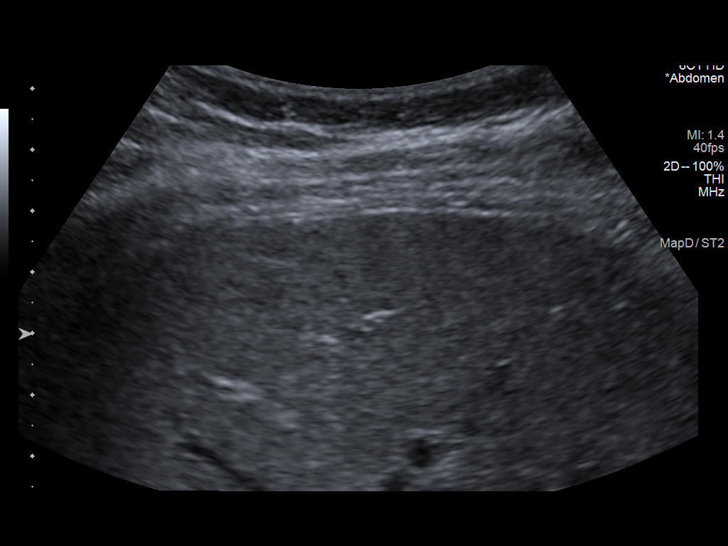
[im 3/19]
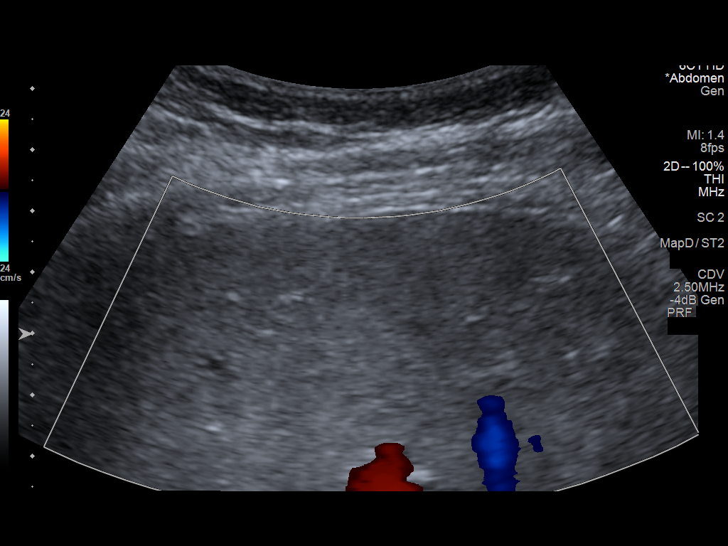
[im 4/19]
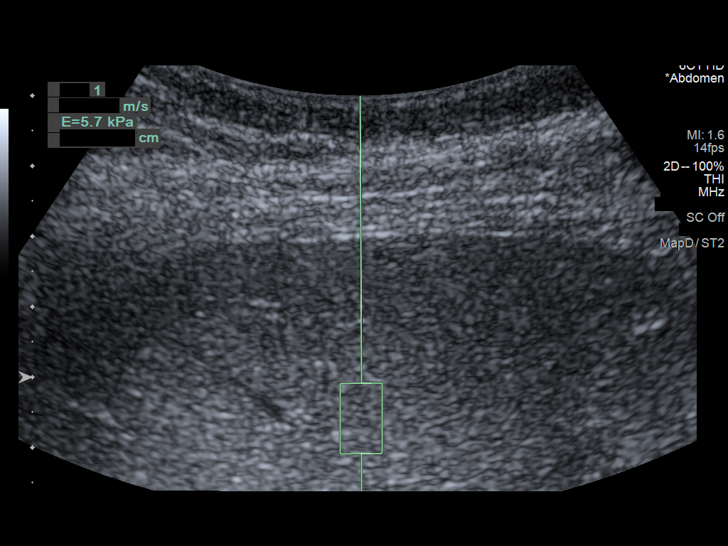
[im 6/19]
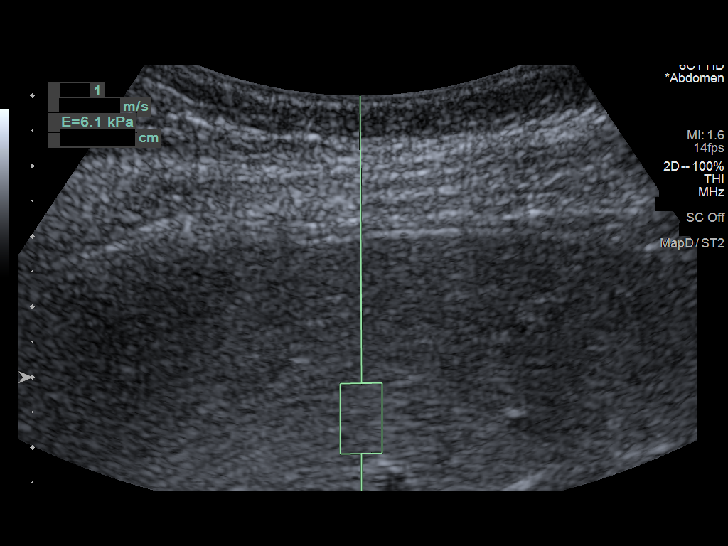
[im 7/19]
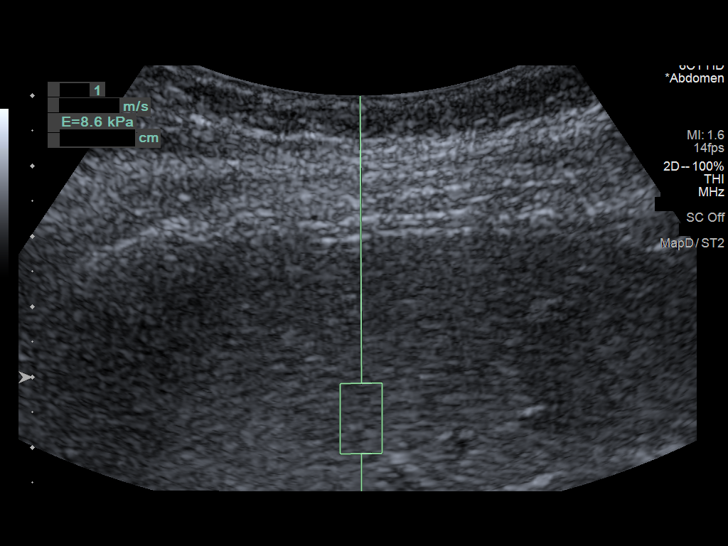
[im 9/19]
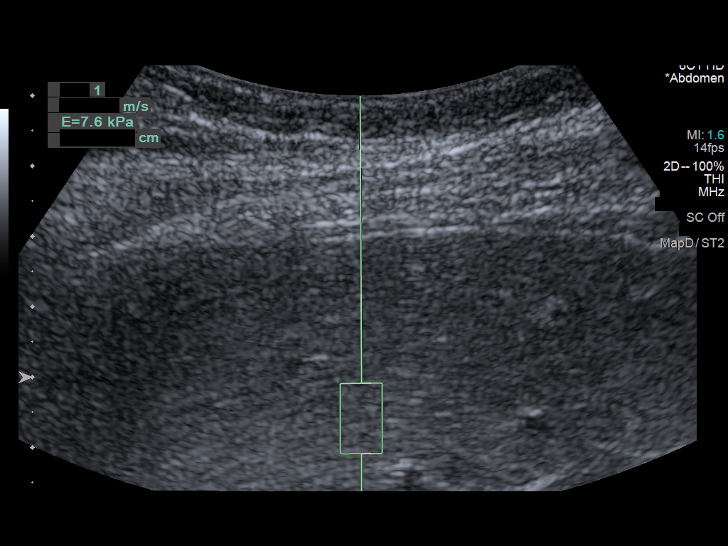
[im 10/19]
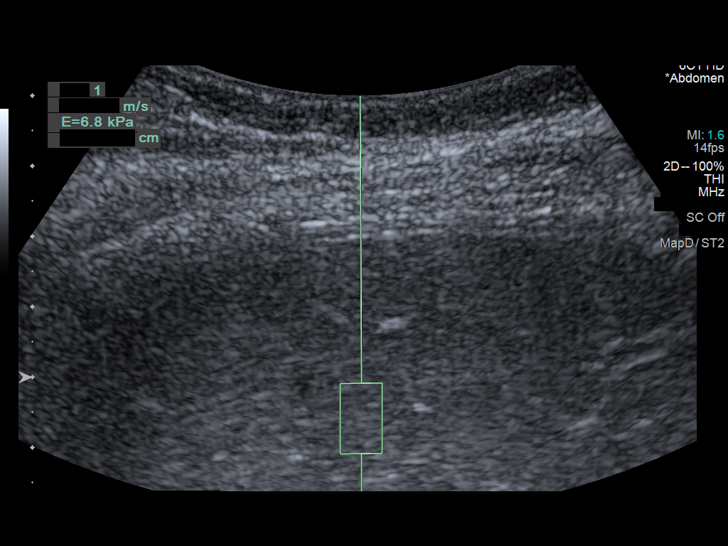
[im 11/19]
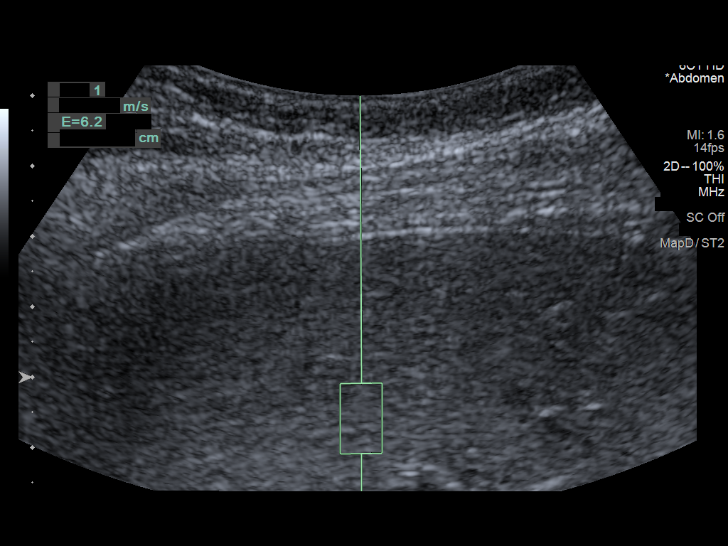
[im 13/19]
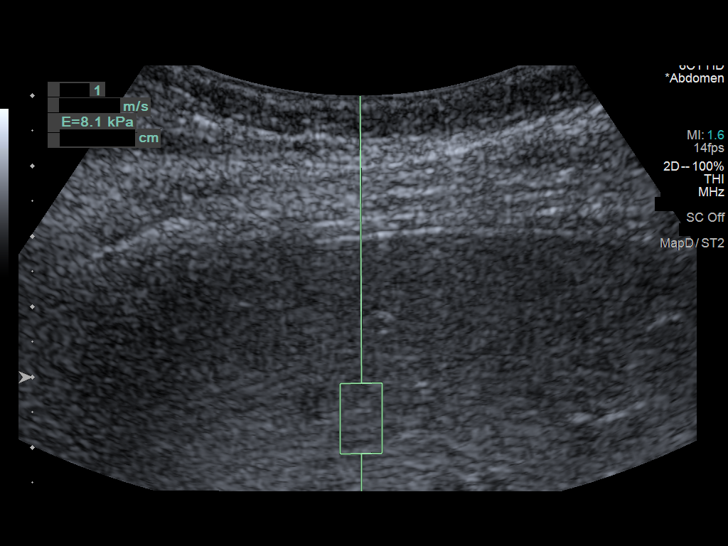
[im 14/19]
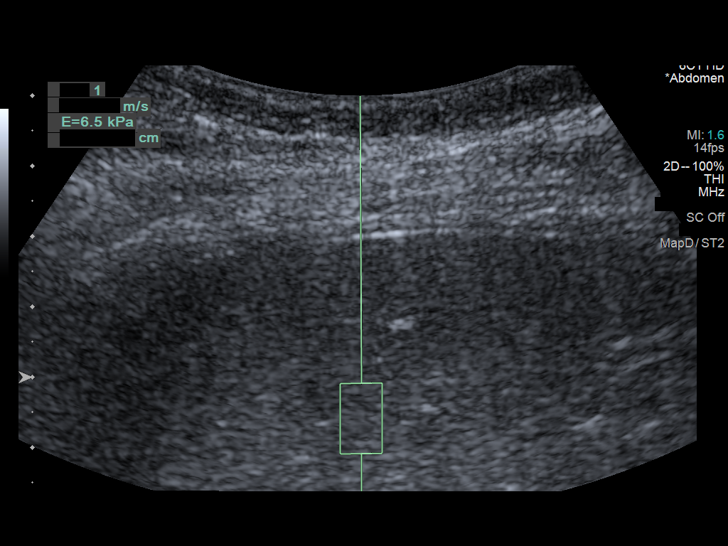
[im 16/19]
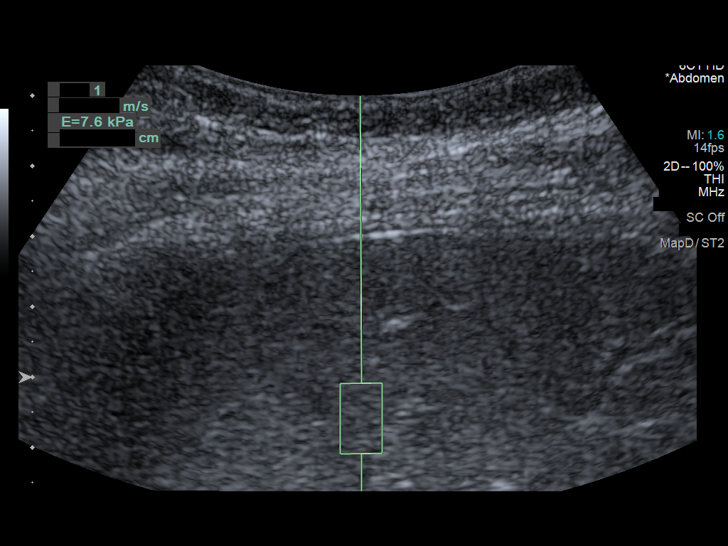
[im 17/19]
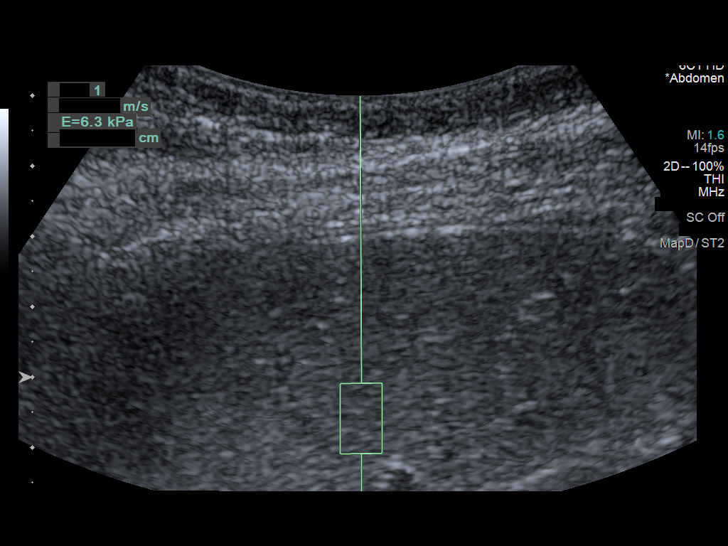
[im 19/19]
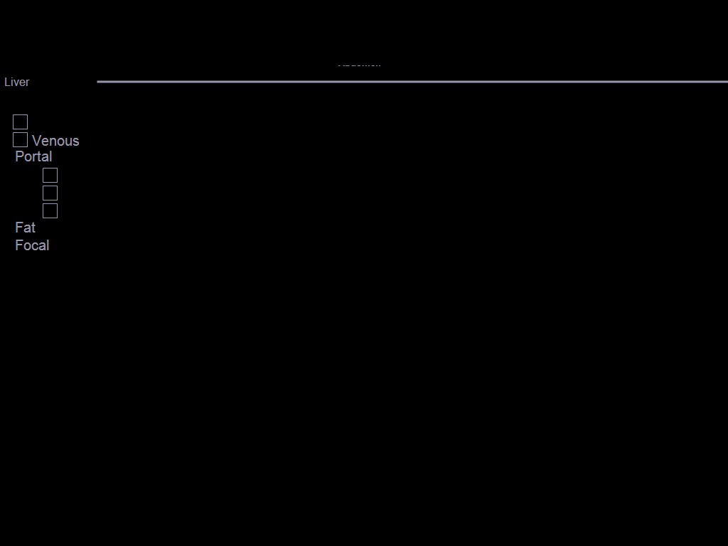

[13 of 19 positions shown; findings below may reference images not displayed]

FINDINGS: ULTRASOUND ABDOMEN

Gallbladder: No gallstones or wall thickening visualized. No
sonographic Murphy sign noted by sonographer.

Common bile duct: Diameter: 5 mm, within normal limits.

Liver: No focal lesion identified. Within normal limits in
parenchymal echogenicity. Portal vein is patent on color Doppler
imaging with normal direction of blood flow towards the liver.

IVC: No abnormality visualized.

Pancreas: Not visualized due to overlying bowel gas.

Spleen: Size and appearance within normal limits.

Right Kidney: Length: 11.0 cm. Echogenicity within normal limits. No
mass or hydronephrosis visualized.

Left Kidney: Length: 10.9 cm. Echogenicity within normal limits. No
mass or hydronephrosis visualized.

Abdominal aorta: No aneurysm visualized.

Other findings: None.

ULTRASOUND HEPATIC ELASTOGRAPHY

Device: Siemens Helix VTQ

Patient position: Supine

Transducer [id number]

Number of measurements: 10

Hepatic segment:  8

Median velocity:   1.51 m/sec

IQR:

IQR/Median velocity ratio:

Corresponding Metavir fibrosis score:  F2 + some F3

Risk of fibrosis: Moderate

Limitations of exam: None

Please note that abnormal shear wave velocities may also be
identified in clinical settings other than with hepatic fibrosis,
such as: acute hepatitis, elevated right heart and central venous
pressures including use of beta blockers, Klever disease
([place]), infiltrative processes such as
mastocytosis/amyloidosis/infiltrative tumor, extrahepatic
cholestasis, in the post-prandial state, and liver transplantation.
Correlation with patient history, laboratory data, and clinical
condition recommended.
IMPRESSION: ULTRASOUND ABDOMEN:

Negative. No hepatobiliary or other significant abnormality
identified.

ULTRASOUND HEPATIC ELASTOGRAPHY:

Median hepatic shear wave velocity is calculated at 1.51 m/sec.

Corresponding Metavir fibrosis score is F2 + some F3.

Risk of fibrosis is Moderate.

Follow-up: Additional testing appropriate

## 2020-08-16 ENCOUNTER — Ambulatory Visit (HOSPITAL_COMMUNITY)
Admission: RE | Admit: 2020-08-16 | Discharge: 2020-08-16 | Disposition: A | Discharge status: Home / Self Care | Payer: Medicare Other | Source: Ambulatory Visit | Attending: Gastroenterology | Admitting: Gastroenterology

## 2020-08-16 ENCOUNTER — Other Ambulatory Visit: Payer: Self-pay

## 2020-08-16 DIAGNOSIS — Z8619 Personal history of other infectious and parasitic diseases: Secondary | ICD-10-CM | POA: Diagnosis present

## 2020-08-21 ENCOUNTER — Other Ambulatory Visit: Payer: Self-pay | Admitting: Gastroenterology

## 2020-08-21 DIAGNOSIS — B1920 Unspecified viral hepatitis C without hepatic coma: Secondary | ICD-10-CM

## 2020-09-07 ENCOUNTER — Other Ambulatory Visit: Payer: Self-pay | Admitting: Gastroenterology

## 2020-09-20 LAB — HCV GT3 NS5A DRUG RESIST ASSAY

## 2020-09-28 ENCOUNTER — Other Ambulatory Visit: Payer: Self-pay | Admitting: *Deleted

## 2020-09-28 DIAGNOSIS — B1920 Unspecified viral hepatitis C without hepatic coma: Secondary | ICD-10-CM

## 2020-10-06 LAB — HCV RNA QUANT
HCV log10: 6.425 log10 IU/mL
Hepatitis C Quantitation: 2658250 IU/mL

## 2020-10-06 LAB — SPECIMEN STATUS REPORT

## 2020-11-02 ENCOUNTER — Telehealth: Payer: Self-pay

## 2020-11-02 NOTE — Telephone Encounter (Signed)
Noted  

## 2020-11-02 NOTE — Telephone Encounter (Signed)
Received fax from Liver clinic. Pt has declined to schedule a new pt appt with their office. Did not show for appt. They also mailed pt letter. Will have letter and fax scanned into chart.  FYI to [employer] PA

## 2020-11-02 NOTE — Telephone Encounter (Signed)
Spoke with [facility]. Pt missed his appointment due to someone in the facility having a bad cough. She is going to call the liver clinic to get pt rescheduled.

## 2020-11-02 NOTE — Telephone Encounter (Signed)
Noted. Patient resides at [facility]. Was there a transportation issue? He has Hep C and has failed treatment. Due to this, I would like for him to see a liver specialists to treat his Hep C.   Can we touch base with [facility] regarding this?

## 2020-11-22 ENCOUNTER — Encounter: Payer: Self-pay | Admitting: Podiatry

## 2020-11-22 ENCOUNTER — Ambulatory Visit (INDEPENDENT_AMBULATORY_CARE_PROVIDER_SITE_OTHER): Payer: Medicare Other | Admitting: Podiatry

## 2020-11-22 ENCOUNTER — Other Ambulatory Visit: Payer: Self-pay

## 2020-11-22 DIAGNOSIS — M79674 Pain in right toe(s): Secondary | ICD-10-CM | POA: Diagnosis not present

## 2020-11-22 DIAGNOSIS — M79675 Pain in left toe(s): Secondary | ICD-10-CM

## 2020-11-22 DIAGNOSIS — B351 Tinea unguium: Secondary | ICD-10-CM

## 2020-11-22 NOTE — Progress Notes (Signed)
This patient returns to the office for evaluation and treatment of long thick painful nails .  This patient is unable to trim his own nails since the patient cannot reach his feet.  Patient says the nails are painful walking and wearing his shoes. He is brought to the office by caregiver at his hoe.  He returns for preventive foot care services.  General Appearance  Alert, conversant and in no acute stress.  Vascular  Dorsalis pedis and posterior tibial  pulses are palpable  bilaterally.  Capillary return is within normal limits  bilaterally. Temperature is within normal limits  bilaterally.  Neurologic  Senn-Weinstein monofilament wire test within normal limits  bilaterally. Muscle power within normal limits bilaterally.  Nails Thick disfigured discolored nails with subungual debris  from hallux to fifth toes bilaterally. No evidence of bacterial infection or drainage bilaterally.  Orthopedic  No limitations of motion  feet .  No crepitus or effusions noted.  No bony pathology or digital deformities noted.  HAV  B/L.  Skin  normotropic skin with no porokeratosis noted bilaterally.  No signs of infections or ulcers noted.     Onychomycosis  Pain in toes right foot  Pain in toes left foot  Debridement  of nails  1-5  B/L with a nail nipper.  Nails were then filed using a dremel tool with no incidents.    RTC  Prn.   Helane Gunther DPM

## 2020-11-27 DIAGNOSIS — F209 Schizophrenia, unspecified: Secondary | ICD-10-CM | POA: Diagnosis not present

## 2020-11-27 DIAGNOSIS — B182 Chronic viral hepatitis C: Secondary | ICD-10-CM | POA: Diagnosis not present

## 2020-11-29 DIAGNOSIS — B182 Chronic viral hepatitis C: Secondary | ICD-10-CM | POA: Diagnosis not present

## 2020-11-29 DIAGNOSIS — K7401 Hepatic fibrosis, early fibrosis: Secondary | ICD-10-CM | POA: Diagnosis not present

## 2020-12-13 DIAGNOSIS — Z6831 Body mass index (BMI) 31.0-31.9, adult: Secondary | ICD-10-CM | POA: Diagnosis not present

## 2020-12-13 DIAGNOSIS — F17200 Nicotine dependence, unspecified, uncomplicated: Secondary | ICD-10-CM | POA: Diagnosis not present

## 2020-12-13 DIAGNOSIS — F209 Schizophrenia, unspecified: Secondary | ICD-10-CM | POA: Diagnosis not present

## 2020-12-27 DIAGNOSIS — B182 Chronic viral hepatitis C: Secondary | ICD-10-CM | POA: Diagnosis not present

## 2020-12-28 DIAGNOSIS — B182 Chronic viral hepatitis C: Secondary | ICD-10-CM | POA: Diagnosis not present

## 2020-12-28 DIAGNOSIS — K7401 Hepatic fibrosis, early fibrosis: Secondary | ICD-10-CM | POA: Diagnosis not present

## 2021-01-13 DIAGNOSIS — F17200 Nicotine dependence, unspecified, uncomplicated: Secondary | ICD-10-CM | POA: Diagnosis not present

## 2021-01-13 DIAGNOSIS — F209 Schizophrenia, unspecified: Secondary | ICD-10-CM | POA: Diagnosis not present

## 2021-02-01 DIAGNOSIS — B182 Chronic viral hepatitis C: Secondary | ICD-10-CM | POA: Diagnosis not present

## 2021-02-10 DIAGNOSIS — B182 Chronic viral hepatitis C: Secondary | ICD-10-CM | POA: Diagnosis not present

## 2021-02-10 DIAGNOSIS — F17210 Nicotine dependence, cigarettes, uncomplicated: Secondary | ICD-10-CM | POA: Diagnosis not present

## 2021-02-21 ENCOUNTER — Ambulatory Visit (INDEPENDENT_AMBULATORY_CARE_PROVIDER_SITE_OTHER): Payer: Medicare Other | Admitting: Podiatry

## 2021-02-21 ENCOUNTER — Other Ambulatory Visit: Payer: Self-pay

## 2021-02-21 ENCOUNTER — Encounter: Payer: Self-pay | Admitting: Podiatry

## 2021-02-21 DIAGNOSIS — M79674 Pain in right toe(s): Secondary | ICD-10-CM

## 2021-02-21 DIAGNOSIS — M79675 Pain in left toe(s): Secondary | ICD-10-CM | POA: Diagnosis not present

## 2021-02-21 DIAGNOSIS — B351 Tinea unguium: Secondary | ICD-10-CM | POA: Diagnosis not present

## 2021-02-21 NOTE — Progress Notes (Signed)
This patient returns to the office for evaluation and treatment of long thick painful nails .  This patient is unable to trim his own nails since the patient cannot reach his feet.  Patient says the nails are painful walking and wearing his shoes. He is brought to the office by caregiver at his hoe.  He returns for preventive foot care services.  General Appearance  Alert, conversant and in no acute stress.  Vascular  Dorsalis pedis and posterior tibial  pulses are palpable  bilaterally.  Capillary return is within normal limits  bilaterally. Temperature is within normal limits  bilaterally.  Neurologic  Senn-Weinstein monofilament wire test within normal limits  bilaterally. Muscle power within normal limits bilaterally.  Nails Thick disfigured discolored nails with subungual debris  from hallux to fifth toes bilaterally. No evidence of bacterial infection or drainage bilaterally.  Orthopedic  No limitations of motion  feet .  No crepitus or effusions noted.  No bony pathology or digital deformities noted.  HAV  B/L.  Skin  normotropic skin with no porokeratosis noted bilaterally.  No signs of infections or ulcers noted.   Callus on dorsomedial aspect  B/L.  Onychomycosis  Pain in toes right foot  Pain in toes left foot  Debridement  of nails  1-5  B/L with a nail nipper.  Nails were then filed using a dremel tool with no incidents.    RTC  3 months   Helane Gunther DPM

## 2021-03-13 DIAGNOSIS — F17210 Nicotine dependence, cigarettes, uncomplicated: Secondary | ICD-10-CM | POA: Diagnosis not present

## 2021-03-13 DIAGNOSIS — F209 Schizophrenia, unspecified: Secondary | ICD-10-CM | POA: Diagnosis not present

## 2021-03-29 DIAGNOSIS — B182 Chronic viral hepatitis C: Secondary | ICD-10-CM | POA: Diagnosis not present

## 2021-04-12 DIAGNOSIS — F209 Schizophrenia, unspecified: Secondary | ICD-10-CM | POA: Diagnosis not present

## 2021-04-12 DIAGNOSIS — F17200 Nicotine dependence, unspecified, uncomplicated: Secondary | ICD-10-CM | POA: Diagnosis not present

## 2021-04-23 DIAGNOSIS — B182 Chronic viral hepatitis C: Secondary | ICD-10-CM | POA: Diagnosis not present

## 2021-05-07 DIAGNOSIS — H2513 Age-related nuclear cataract, bilateral: Secondary | ICD-10-CM | POA: Diagnosis not present

## 2021-05-13 DIAGNOSIS — F17200 Nicotine dependence, unspecified, uncomplicated: Secondary | ICD-10-CM | POA: Diagnosis not present

## 2021-05-13 DIAGNOSIS — F209 Schizophrenia, unspecified: Secondary | ICD-10-CM | POA: Diagnosis not present

## 2021-05-29 ENCOUNTER — Encounter: Payer: Self-pay | Admitting: Podiatry

## 2021-05-29 ENCOUNTER — Ambulatory Visit (INDEPENDENT_AMBULATORY_CARE_PROVIDER_SITE_OTHER): Payer: Medicare Other | Admitting: Podiatry

## 2021-05-29 ENCOUNTER — Other Ambulatory Visit: Payer: Self-pay

## 2021-05-29 DIAGNOSIS — M79675 Pain in left toe(s): Secondary | ICD-10-CM

## 2021-05-29 DIAGNOSIS — M79674 Pain in right toe(s): Secondary | ICD-10-CM | POA: Diagnosis not present

## 2021-05-29 DIAGNOSIS — B351 Tinea unguium: Secondary | ICD-10-CM

## 2021-05-29 NOTE — Progress Notes (Signed)
This patient returns to the office for evaluation and treatment of long thick painful nails .  This patient is unable to trim his own nails since the patient cannot reach his feet.  Patient says the nails are painful walking and wearing his shoes. He is brought to the office by caregiver at his hoe.  He returns for preventive foot care services.  General Appearance  Alert, conversant and in no acute stress.  Vascular  Dorsalis pedis and posterior tibial  pulses are palpable  bilaterally.  Capillary return is within normal limits  bilaterally. Temperature is within normal limits  bilaterally.  Neurologic  Senn-Weinstein monofilament wire test within normal limits  bilaterally. Muscle power within normal limits bilaterally.  Nails Thick disfigured discolored nails with subungual debris  from hallux to fifth toes bilaterally. No evidence of bacterial infection or drainage bilaterally.  Orthopedic  No limitations of motion  feet .  No crepitus or effusions noted.  No bony pathology or digital deformities noted.  HAV  B/L.  Skin  normotropic skin with no porokeratosis noted bilaterally.  No signs of infections or ulcers noted.   Callus on dorsomedial aspect  B/L.  Onychomycosis  Pain in toes right foot  Pain in toes left foot  Debridement  of nails  1-5  B/L with a nail nipper.  Nails were then filed using a dremel tool with no incidents.    RTC  3 months   Helane Gunther DPM

## 2021-06-12 DIAGNOSIS — G3184 Mild cognitive impairment, so stated: Secondary | ICD-10-CM | POA: Diagnosis not present

## 2021-06-12 DIAGNOSIS — F209 Schizophrenia, unspecified: Secondary | ICD-10-CM | POA: Diagnosis not present

## 2021-06-12 DIAGNOSIS — B182 Chronic viral hepatitis C: Secondary | ICD-10-CM | POA: Diagnosis not present

## 2021-06-12 DIAGNOSIS — F17210 Nicotine dependence, cigarettes, uncomplicated: Secondary | ICD-10-CM | POA: Diagnosis not present

## 2021-06-12 DIAGNOSIS — J42 Unspecified chronic bronchitis: Secondary | ICD-10-CM | POA: Diagnosis not present

## 2021-06-12 DIAGNOSIS — Z1331 Encounter for screening for depression: Secondary | ICD-10-CM | POA: Diagnosis not present

## 2021-06-12 DIAGNOSIS — Z1389 Encounter for screening for other disorder: Secondary | ICD-10-CM | POA: Diagnosis not present

## 2021-06-21 DIAGNOSIS — Z79899 Other long term (current) drug therapy: Secondary | ICD-10-CM | POA: Diagnosis not present

## 2021-06-21 DIAGNOSIS — Z0001 Encounter for general adult medical examination with abnormal findings: Secondary | ICD-10-CM | POA: Diagnosis not present

## 2021-06-21 DIAGNOSIS — B182 Chronic viral hepatitis C: Secondary | ICD-10-CM | POA: Diagnosis not present

## 2021-07-13 DIAGNOSIS — B182 Chronic viral hepatitis C: Secondary | ICD-10-CM | POA: Diagnosis not present

## 2021-07-13 DIAGNOSIS — F0150 Vascular dementia without behavioral disturbance: Secondary | ICD-10-CM | POA: Diagnosis not present

## 2021-08-13 DIAGNOSIS — F17200 Nicotine dependence, unspecified, uncomplicated: Secondary | ICD-10-CM | POA: Diagnosis not present

## 2021-08-13 DIAGNOSIS — J42 Unspecified chronic bronchitis: Secondary | ICD-10-CM | POA: Diagnosis not present

## 2021-08-13 DIAGNOSIS — F209 Schizophrenia, unspecified: Secondary | ICD-10-CM | POA: Diagnosis not present

## 2021-08-14 DIAGNOSIS — B182 Chronic viral hepatitis C: Secondary | ICD-10-CM | POA: Diagnosis not present

## 2021-08-14 DIAGNOSIS — K7401 Hepatic fibrosis, early fibrosis: Secondary | ICD-10-CM | POA: Diagnosis not present

## 2021-09-04 ENCOUNTER — Encounter: Payer: Self-pay | Admitting: Podiatry

## 2021-09-04 ENCOUNTER — Other Ambulatory Visit: Payer: Self-pay

## 2021-09-04 ENCOUNTER — Ambulatory Visit (INDEPENDENT_AMBULATORY_CARE_PROVIDER_SITE_OTHER): Payer: Medicare Other | Admitting: Podiatry

## 2021-09-04 DIAGNOSIS — M79675 Pain in left toe(s): Secondary | ICD-10-CM

## 2021-09-04 DIAGNOSIS — M79674 Pain in right toe(s): Secondary | ICD-10-CM | POA: Diagnosis not present

## 2021-09-04 DIAGNOSIS — B351 Tinea unguium: Secondary | ICD-10-CM

## 2021-09-04 NOTE — Progress Notes (Signed)
This patient returns to the office for evaluation and treatment of long thick painful nails .  This patient is unable to trim his own nails since the patient cannot reach his feet.  Patient says the nails are painful walking and wearing his shoes. He is brought to the office by caregiver at his hoe.  He returns for preventive foot care services.  General Appearance  Alert, conversant and in no acute stress.  Vascular  Dorsalis pedis and posterior tibial  pulses are palpable  bilaterally.  Capillary return is within normal limits  bilaterally. Temperature is within normal limits  bilaterally.  Neurologic  Senn-Weinstein monofilament wire test within normal limits  bilaterally. Muscle power within normal limits bilaterally.  Nails Thick disfigured discolored nails with subungual debris  from hallux to fifth toes bilaterally. No evidence of bacterial infection or drainage bilaterally.  Orthopedic  No limitations of motion  feet .  No crepitus or effusions noted.  No bony pathology or digital deformities noted.  HAV  B/L.  Skin  normotropic skin with no porokeratosis noted bilaterally.  No signs of infections or ulcers noted.     Onychomycosis  Pain in toes right foot  Pain in toes left foot  Debridement  of nails  1-5  B/L with a nail nipper.  Nails were then filed using a dremel tool with no incidents.    RTC  3 months   Helane Gunther DPM

## 2021-09-12 DIAGNOSIS — B182 Chronic viral hepatitis C: Secondary | ICD-10-CM | POA: Diagnosis not present

## 2021-09-12 DIAGNOSIS — F209 Schizophrenia, unspecified: Secondary | ICD-10-CM | POA: Diagnosis not present

## 2021-10-13 DIAGNOSIS — B182 Chronic viral hepatitis C: Secondary | ICD-10-CM | POA: Diagnosis not present

## 2021-10-13 DIAGNOSIS — F17200 Nicotine dependence, unspecified, uncomplicated: Secondary | ICD-10-CM | POA: Diagnosis not present

## 2021-10-15 IMAGING — US US ABDOMEN LIMITED
1 series · 14 of 25 positions shown · non-contrast
Comparison: June 07, 2019

CLINICAL DATA: Reported hepatic cirrhosis

EXAM:
ULTRASOUND ABDOMEN LIMITED RIGHT UPPER QUADRANT

[Series 1: us abdomen limited ruq · 14 of 59 slices shown]
[im 1/59]
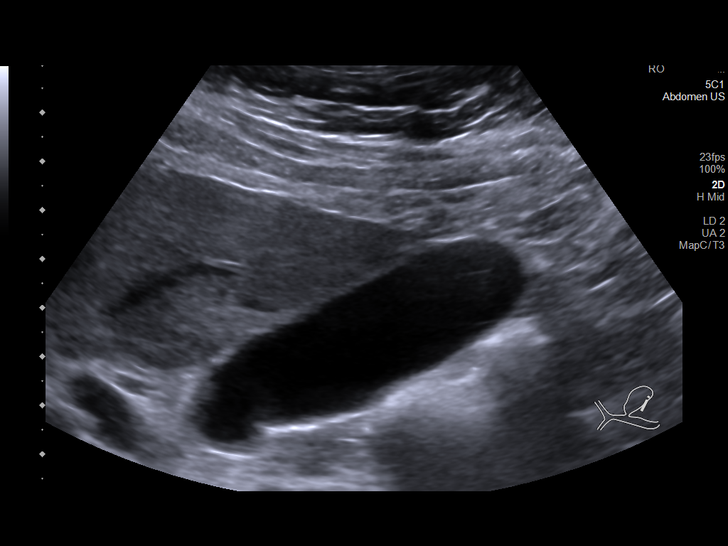
[im 5/59]
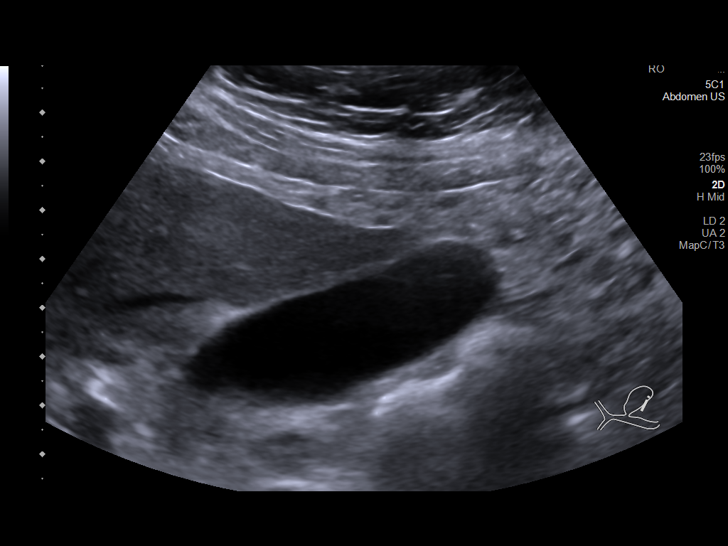
[im 10/59]
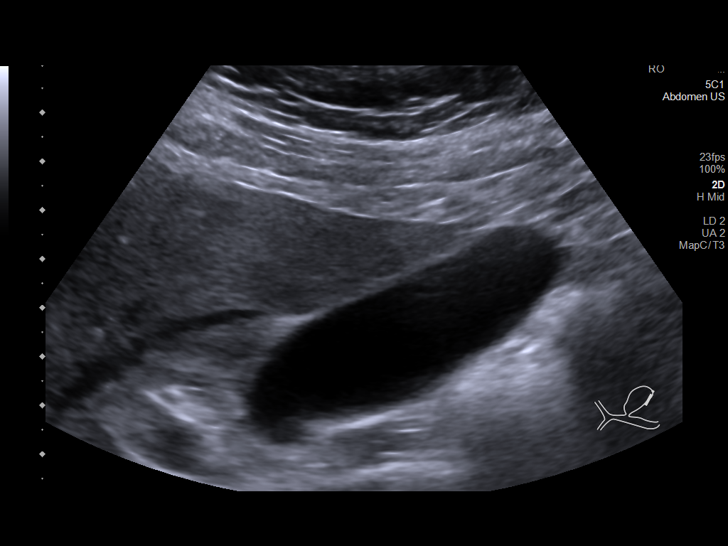
[im 15/59]
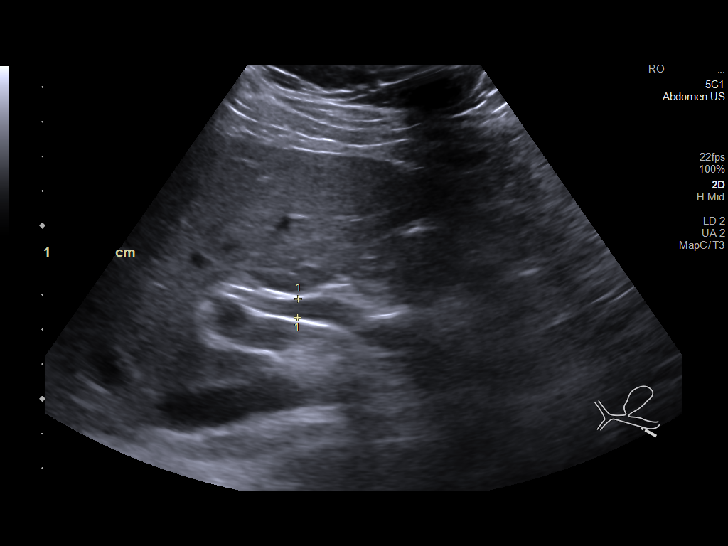
[im 20/59]
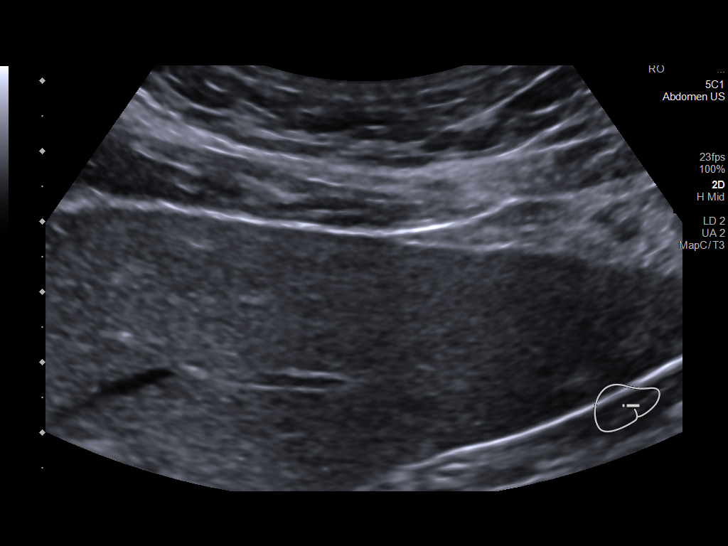
[im 22/59]
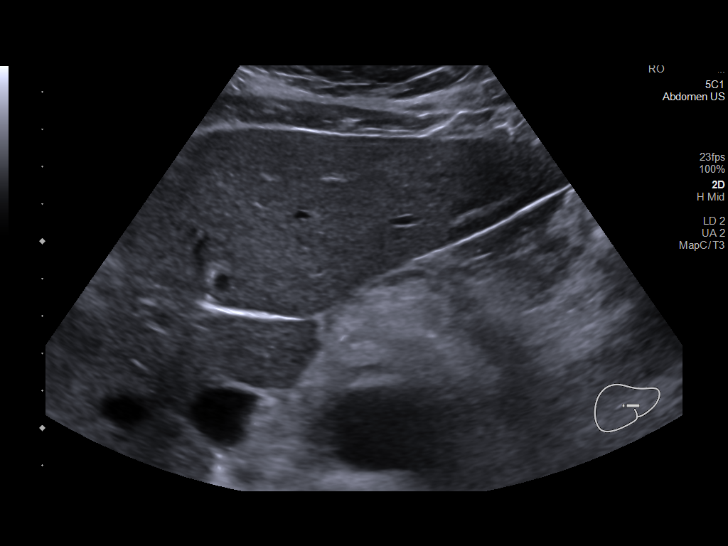
[im 27/59]
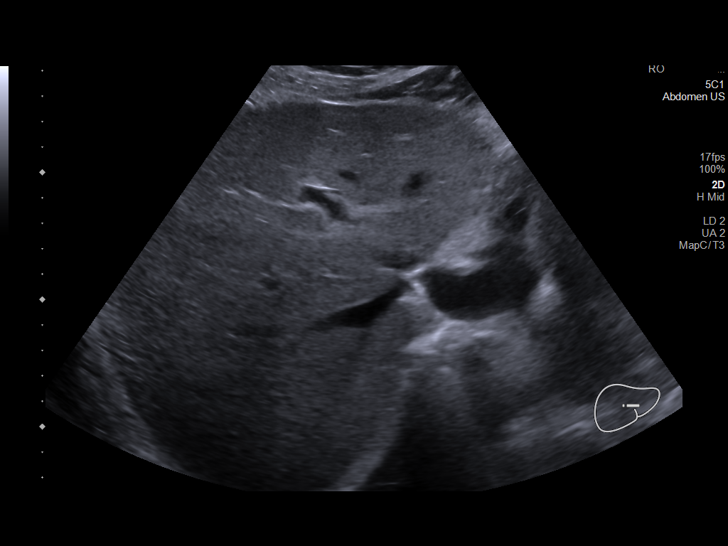
[im 32/59]
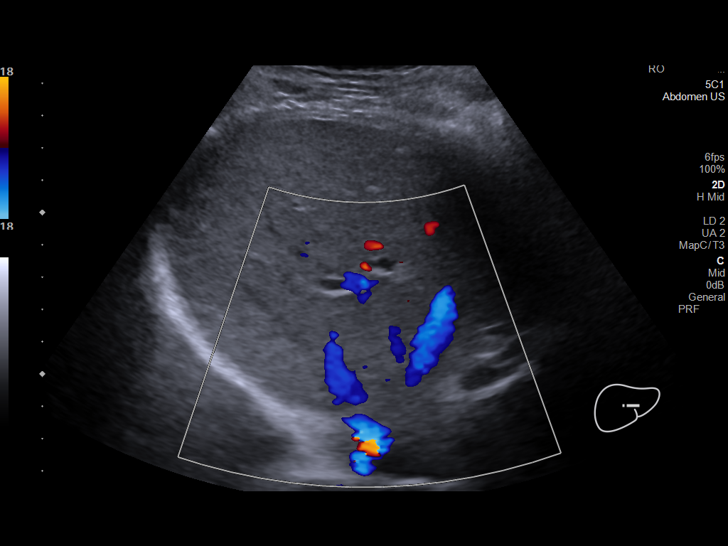
[im 37/59]
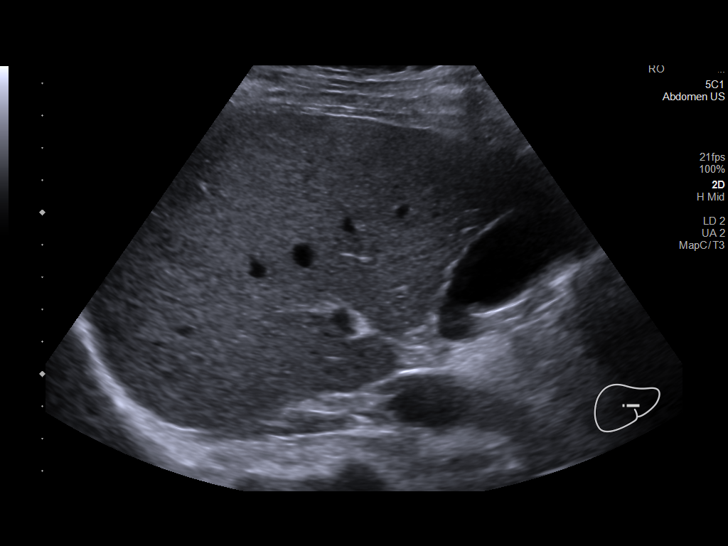
[im 39/59]
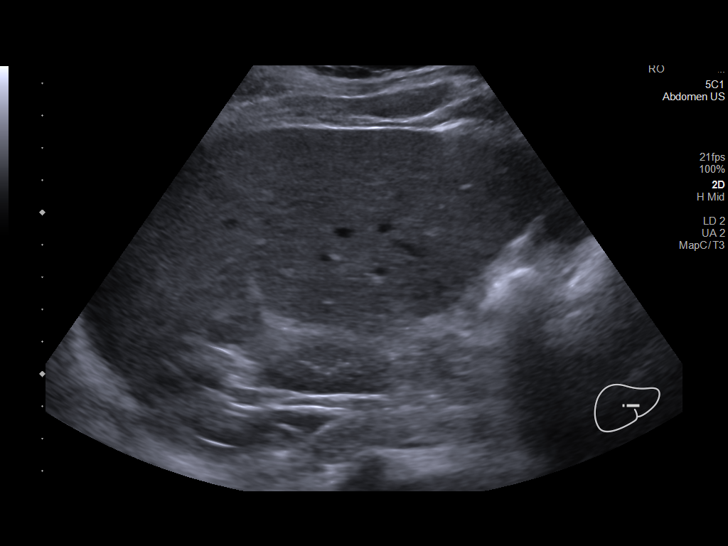
[im 44/59]
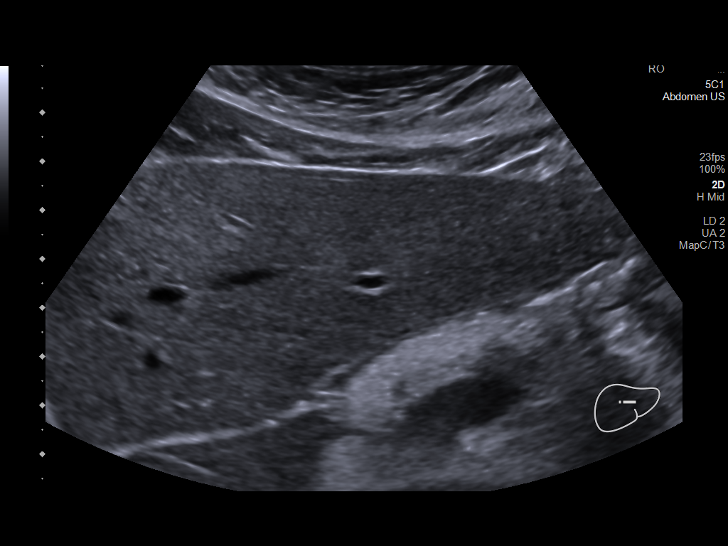
[im 49/59]
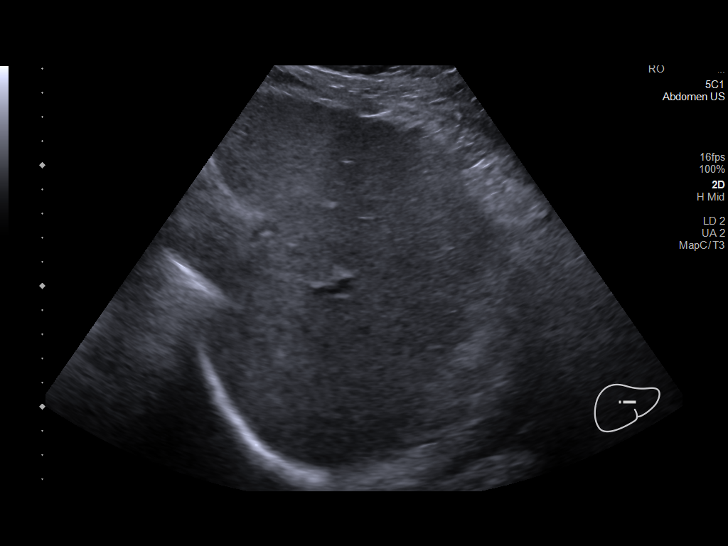
[im 54/59]
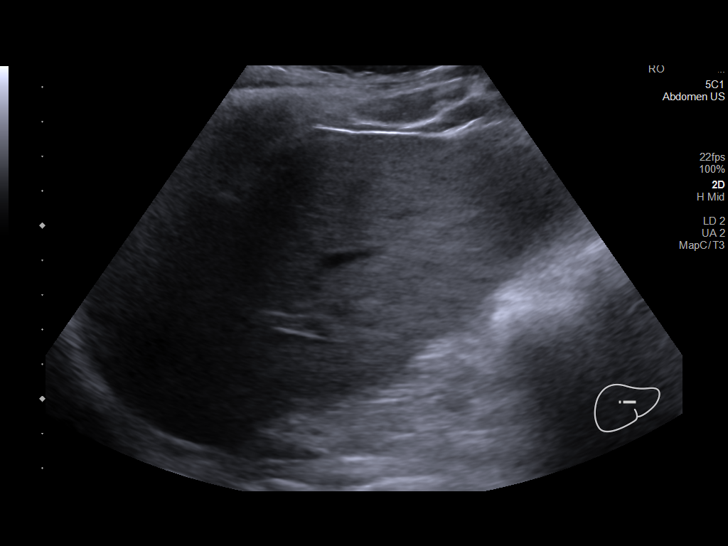
[im 59/59]
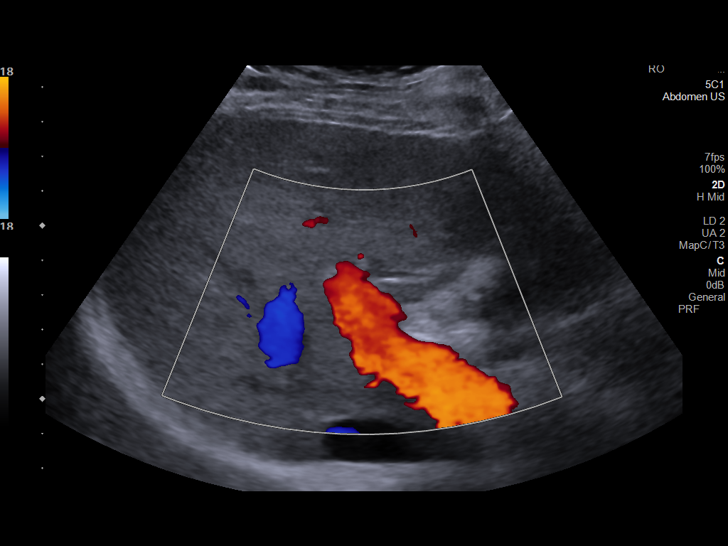

[14 of 25 positions shown; findings below may reference images not displayed]

FINDINGS: Gallbladder:

No gallstones or wall thickening visualized. There is no
pericholecystic fluid. No sonographic Murphy sign noted by
sonographer.

Common bile duct:

Diameter: 5 mm. No intrahepatic or extrahepatic biliary duct
dilatation.

Liver:

No focal lesion identified. Liver contour appears smooth. Within
normal limits in parenchymal echogenicity. Portal vein is patent on
color Doppler imaging with normal direction of blood flow towards
the liver.

Other: None.
IMPRESSION: Study within normal limits.

## 2021-11-12 DIAGNOSIS — F209 Schizophrenia, unspecified: Secondary | ICD-10-CM | POA: Diagnosis not present

## 2021-11-12 DIAGNOSIS — B182 Chronic viral hepatitis C: Secondary | ICD-10-CM | POA: Diagnosis not present

## 2021-11-29 DIAGNOSIS — F17200 Nicotine dependence, unspecified, uncomplicated: Secondary | ICD-10-CM | POA: Diagnosis not present

## 2021-11-29 DIAGNOSIS — J410 Simple chronic bronchitis: Secondary | ICD-10-CM | POA: Diagnosis not present

## 2021-11-29 DIAGNOSIS — F17210 Nicotine dependence, cigarettes, uncomplicated: Secondary | ICD-10-CM | POA: Diagnosis not present

## 2021-11-29 DIAGNOSIS — F209 Schizophrenia, unspecified: Secondary | ICD-10-CM | POA: Diagnosis not present

## 2021-11-29 DIAGNOSIS — E782 Mixed hyperlipidemia: Secondary | ICD-10-CM | POA: Diagnosis not present

## 2021-12-05 ENCOUNTER — Ambulatory Visit: Payer: Medicare Other | Admitting: Podiatry

## 2021-12-19 ENCOUNTER — Encounter: Payer: Self-pay | Admitting: Podiatry

## 2021-12-19 ENCOUNTER — Other Ambulatory Visit: Payer: Self-pay

## 2021-12-19 ENCOUNTER — Ambulatory Visit (INDEPENDENT_AMBULATORY_CARE_PROVIDER_SITE_OTHER): Payer: Medicare Other | Admitting: Podiatry

## 2021-12-19 DIAGNOSIS — M79675 Pain in left toe(s): Secondary | ICD-10-CM

## 2021-12-19 DIAGNOSIS — B351 Tinea unguium: Secondary | ICD-10-CM | POA: Diagnosis not present

## 2021-12-19 DIAGNOSIS — M79674 Pain in right toe(s): Secondary | ICD-10-CM | POA: Diagnosis not present

## 2021-12-19 NOTE — Progress Notes (Signed)
This patient returns to the office for evaluation and treatment of long thick painful nails .  This patient is unable to trim his own nails since the patient cannot reach his feet.  Patient says the nails are painful walking and wearing his shoes. He is brought to the office by caregiver at his hoe.  He returns for preventive foot care services.  General Appearance  Alert, conversant and in no acute stress.  Vascular  Dorsalis pedis and posterior tibial  pulses are palpable  bilaterally.  Capillary return is within normal limits  bilaterally. Temperature is within normal limits  bilaterally.  Neurologic  Senn-Weinstein monofilament wire test within normal limits  bilaterally. Muscle power within normal limits bilaterally.  Nails Thick disfigured discolored nails with subungual debris  from hallux to fifth toes bilaterally. No evidence of bacterial infection or drainage bilaterally.  Orthopedic  No limitations of motion  feet .  No crepitus or effusions noted.  No bony pathology or digital deformities noted.  HAV  B/L.  Skin  normotropic skin with no porokeratosis noted bilaterally.  No signs of infections or ulcers noted.     Onychomycosis  Pain in toes right foot  Pain in toes left foot  Debridement  of nails  1-5  B/L with a nail nipper.  Nails were then filed using a dremel tool with no incidents.    RTC  4  months   Helane Gunther DPM

## 2021-12-30 DIAGNOSIS — F17200 Nicotine dependence, unspecified, uncomplicated: Secondary | ICD-10-CM | POA: Diagnosis not present

## 2021-12-30 DIAGNOSIS — F209 Schizophrenia, unspecified: Secondary | ICD-10-CM | POA: Diagnosis not present

## 2022-01-01 DIAGNOSIS — Z23 Encounter for immunization: Secondary | ICD-10-CM | POA: Diagnosis not present

## 2022-01-03 DIAGNOSIS — B182 Chronic viral hepatitis C: Secondary | ICD-10-CM | POA: Diagnosis not present

## 2022-01-03 DIAGNOSIS — Z79899 Other long term (current) drug therapy: Secondary | ICD-10-CM | POA: Diagnosis not present

## 2022-01-28 DIAGNOSIS — G3184 Mild cognitive impairment, so stated: Secondary | ICD-10-CM | POA: Diagnosis not present

## 2022-01-28 DIAGNOSIS — F209 Schizophrenia, unspecified: Secondary | ICD-10-CM | POA: Diagnosis not present

## 2022-02-28 DIAGNOSIS — F209 Schizophrenia, unspecified: Secondary | ICD-10-CM | POA: Diagnosis not present

## 2022-02-28 DIAGNOSIS — B182 Chronic viral hepatitis C: Secondary | ICD-10-CM | POA: Diagnosis not present

## 2022-03-30 DIAGNOSIS — F209 Schizophrenia, unspecified: Secondary | ICD-10-CM | POA: Diagnosis not present

## 2022-03-30 DIAGNOSIS — B182 Chronic viral hepatitis C: Secondary | ICD-10-CM | POA: Diagnosis not present

## 2022-04-22 ENCOUNTER — Encounter: Payer: Self-pay | Admitting: Podiatry

## 2022-04-22 ENCOUNTER — Ambulatory Visit (INDEPENDENT_AMBULATORY_CARE_PROVIDER_SITE_OTHER): Payer: Medicare Other | Admitting: Podiatry

## 2022-04-22 DIAGNOSIS — B351 Tinea unguium: Secondary | ICD-10-CM | POA: Diagnosis not present

## 2022-04-22 DIAGNOSIS — M79674 Pain in right toe(s): Secondary | ICD-10-CM | POA: Diagnosis not present

## 2022-04-22 DIAGNOSIS — M79675 Pain in left toe(s): Secondary | ICD-10-CM

## 2022-04-22 NOTE — Progress Notes (Signed)
This patient returns to the office for evaluation and treatment of long thick painful nails .  This patient is unable to trim his own nails since the patient cannot reach his feet.  Patient says the nails are painful walking and wearing his shoes. He is brought to the office by caregiver at his home.  He returns for preventive foot care services.  General Appearance  Alert, conversant and in no acute stress.  Vascular  Dorsalis pedis and posterior tibial  pulses are palpable  bilaterally.  Capillary return is within normal limits  bilaterally. Temperature is within normal limits  bilaterally.  Neurologic  Senn-Weinstein monofilament wire test within normal limits  bilaterally. Muscle power within normal limits bilaterally.  Nails Thick disfigured discolored nails with subungual debris  from hallux to fifth toes bilaterally. No evidence of bacterial infection or drainage bilaterally.  Orthopedic  No limitations of motion  feet .  No crepitus or effusions noted.  No bony pathology or digital deformities noted.  HAV  B/L.  Skin  normotropic skin with no porokeratosis noted bilaterally.  No signs of infections or ulcers noted.     Onychomycosis  Pain in toes right foot  Pain in toes left foot  Debridement  of nails  1-5  B/L with a nail nipper.  Nails were then filed using a dremel tool with no incidents.    RTC  4  months   Christiona Siddique DPM  

## 2022-04-30 DIAGNOSIS — B182 Chronic viral hepatitis C: Secondary | ICD-10-CM | POA: Diagnosis not present

## 2022-04-30 DIAGNOSIS — F209 Schizophrenia, unspecified: Secondary | ICD-10-CM | POA: Diagnosis not present

## 2022-05-30 DIAGNOSIS — G3184 Mild cognitive impairment, so stated: Secondary | ICD-10-CM | POA: Diagnosis not present

## 2022-05-30 DIAGNOSIS — B182 Chronic viral hepatitis C: Secondary | ICD-10-CM | POA: Diagnosis not present

## 2022-07-10 DIAGNOSIS — Z1389 Encounter for screening for other disorder: Secondary | ICD-10-CM | POA: Diagnosis not present

## 2022-07-10 DIAGNOSIS — Z1331 Encounter for screening for depression: Secondary | ICD-10-CM | POA: Diagnosis not present

## 2022-07-10 DIAGNOSIS — Z0001 Encounter for general adult medical examination with abnormal findings: Secondary | ICD-10-CM | POA: Diagnosis not present

## 2022-07-10 DIAGNOSIS — F17210 Nicotine dependence, cigarettes, uncomplicated: Secondary | ICD-10-CM | POA: Diagnosis not present

## 2022-07-10 DIAGNOSIS — G3184 Mild cognitive impairment, so stated: Secondary | ICD-10-CM | POA: Diagnosis not present

## 2022-07-10 DIAGNOSIS — Z6832 Body mass index (BMI) 32.0-32.9, adult: Secondary | ICD-10-CM | POA: Diagnosis not present

## 2022-07-10 DIAGNOSIS — B182 Chronic viral hepatitis C: Secondary | ICD-10-CM | POA: Diagnosis not present

## 2022-07-10 DIAGNOSIS — F209 Schizophrenia, unspecified: Secondary | ICD-10-CM | POA: Diagnosis not present

## 2022-07-10 DIAGNOSIS — E782 Mixed hyperlipidemia: Secondary | ICD-10-CM | POA: Diagnosis not present

## 2022-07-10 DIAGNOSIS — J42 Unspecified chronic bronchitis: Secondary | ICD-10-CM | POA: Diagnosis not present

## 2022-07-10 DIAGNOSIS — F17200 Nicotine dependence, unspecified, uncomplicated: Secondary | ICD-10-CM | POA: Diagnosis not present

## 2022-08-19 ENCOUNTER — Ambulatory Visit: Payer: Medicare Other | Admitting: Podiatry

## 2022-08-22 DIAGNOSIS — F209 Schizophrenia, unspecified: Secondary | ICD-10-CM | POA: Diagnosis not present

## 2022-08-22 DIAGNOSIS — B182 Chronic viral hepatitis C: Secondary | ICD-10-CM | POA: Diagnosis not present

## 2022-09-02 DIAGNOSIS — Z23 Encounter for immunization: Secondary | ICD-10-CM | POA: Diagnosis not present

## 2022-09-13 ENCOUNTER — Ambulatory Visit: Payer: Medicare Other | Admitting: Podiatry

## 2022-09-22 DIAGNOSIS — B182 Chronic viral hepatitis C: Secondary | ICD-10-CM | POA: Diagnosis not present

## 2022-09-22 DIAGNOSIS — F209 Schizophrenia, unspecified: Secondary | ICD-10-CM | POA: Diagnosis not present

## 2022-10-22 DIAGNOSIS — J42 Unspecified chronic bronchitis: Secondary | ICD-10-CM | POA: Diagnosis not present

## 2022-10-22 DIAGNOSIS — F209 Schizophrenia, unspecified: Secondary | ICD-10-CM | POA: Diagnosis not present

## 2022-11-22 DIAGNOSIS — F209 Schizophrenia, unspecified: Secondary | ICD-10-CM | POA: Diagnosis not present

## 2022-11-22 DIAGNOSIS — J42 Unspecified chronic bronchitis: Secondary | ICD-10-CM | POA: Diagnosis not present

## 2022-11-29 ENCOUNTER — Ambulatory Visit (INDEPENDENT_AMBULATORY_CARE_PROVIDER_SITE_OTHER): Payer: Medicare Other | Admitting: Podiatry

## 2022-11-29 ENCOUNTER — Encounter: Payer: Self-pay | Admitting: Podiatry

## 2022-11-29 DIAGNOSIS — M79674 Pain in right toe(s): Secondary | ICD-10-CM

## 2022-11-29 DIAGNOSIS — B351 Tinea unguium: Secondary | ICD-10-CM

## 2022-11-29 DIAGNOSIS — M79675 Pain in left toe(s): Secondary | ICD-10-CM

## 2022-11-29 NOTE — Progress Notes (Signed)
This patient returns to the office for evaluation and treatment of long thick painful nails .  This patient is unable to trim his own nails since the patient cannot reach his feet.  Patient says the nails are painful walking and wearing his shoes. He is brought to the office by caregiver at his home.  He returns for preventive foot care services.  General Appearance  Alert, conversant and in no acute stress.  Vascular  Dorsalis pedis and posterior tibial  pulses are palpable  bilaterally.  Capillary return is within normal limits  bilaterally. Temperature is within normal limits  bilaterally.  Neurologic  Senn-Weinstein monofilament wire test within normal limits  bilaterally. Muscle power within normal limits bilaterally.  Nails Thick disfigured discolored nails with subungual debris  from hallux to fifth toes bilaterally. No evidence of bacterial infection or drainage bilaterally.  Orthopedic  No limitations of motion  feet .  No crepitus or effusions noted.  No bony pathology or digital deformities noted.  HAV  B/L.  Skin  normotropic skin with no porokeratosis noted bilaterally.  No signs of infections or ulcers noted.     Onychomycosis  Pain in toes right foot  Pain in toes left foot  Debridement  of nails  1-5  B/L with a nail nipper.  Nails were then filed using a dremel tool with no incidents.    RTC  4  months   Gardiner Barefoot DPM

## 2023-01-21 DIAGNOSIS — G3184 Mild cognitive impairment, so stated: Secondary | ICD-10-CM | POA: Diagnosis not present

## 2023-01-21 DIAGNOSIS — J42 Unspecified chronic bronchitis: Secondary | ICD-10-CM | POA: Diagnosis not present

## 2023-01-21 DIAGNOSIS — F209 Schizophrenia, unspecified: Secondary | ICD-10-CM | POA: Diagnosis not present

## 2023-01-21 DIAGNOSIS — F17210 Nicotine dependence, cigarettes, uncomplicated: Secondary | ICD-10-CM | POA: Diagnosis not present

## 2023-02-21 DIAGNOSIS — F209 Schizophrenia, unspecified: Secondary | ICD-10-CM | POA: Diagnosis not present

## 2023-02-21 DIAGNOSIS — J42 Unspecified chronic bronchitis: Secondary | ICD-10-CM | POA: Diagnosis not present

## 2023-03-23 DIAGNOSIS — J42 Unspecified chronic bronchitis: Secondary | ICD-10-CM | POA: Diagnosis not present

## 2023-03-23 DIAGNOSIS — F209 Schizophrenia, unspecified: Secondary | ICD-10-CM | POA: Diagnosis not present

## 2023-04-03 ENCOUNTER — Ambulatory Visit: Payer: Medicare Other | Admitting: Podiatry

## 2023-04-04 ENCOUNTER — Ambulatory Visit: Payer: Medicare Other | Admitting: Podiatry

## 2023-04-09 ENCOUNTER — Encounter: Payer: Self-pay | Admitting: Podiatry

## 2023-04-09 ENCOUNTER — Ambulatory Visit (INDEPENDENT_AMBULATORY_CARE_PROVIDER_SITE_OTHER): Payer: Medicare Other | Admitting: Podiatry

## 2023-04-09 DIAGNOSIS — B351 Tinea unguium: Secondary | ICD-10-CM

## 2023-04-09 DIAGNOSIS — M79674 Pain in right toe(s): Secondary | ICD-10-CM | POA: Diagnosis not present

## 2023-04-09 DIAGNOSIS — M79675 Pain in left toe(s): Secondary | ICD-10-CM

## 2023-04-09 NOTE — Progress Notes (Signed)
This patient returns to the office for evaluation and treatment of long thick painful nails .  This patient is unable to trim his own nails since the patient cannot reach his feet.  Patient says the nails are painful walking and wearing his shoes. He is brought to the office by caregiver at his home.  He returns for preventive foot care services.  General Appearance  Alert, conversant and in no acute stress.  Vascular  Dorsalis pedis and posterior tibial  pulses are palpable  bilaterally.  Capillary return is within normal limits  bilaterally. Temperature is within normal limits  bilaterally.  Neurologic  Senn-Weinstein monofilament wire test within normal limits  bilaterally. Muscle power within normal limits bilaterally.  Nails Thick disfigured discolored nails with subungual debris  from hallux to fifth toes bilaterally. No evidence of bacterial infection or drainage bilaterally.  Orthopedic  No limitations of motion  feet .  No crepitus or effusions noted.  No bony pathology or digital deformities noted.  HAV  B/L.  Skin  normotropic skin with no porokeratosis noted bilaterally.  No signs of infections or ulcers noted.     Onychomycosis  Pain in toes right foot  Pain in toes left foot  Debridement  of nails  1-5  B/L with a nail nipper.  Nails were then filed using a dremel tool with no incidents.    RTC  4  months   Tadashi Burkel DPM  

## 2023-04-24 DIAGNOSIS — F209 Schizophrenia, unspecified: Secondary | ICD-10-CM | POA: Diagnosis not present

## 2023-04-24 DIAGNOSIS — J42 Unspecified chronic bronchitis: Secondary | ICD-10-CM | POA: Diagnosis not present

## 2023-05-24 DIAGNOSIS — F209 Schizophrenia, unspecified: Secondary | ICD-10-CM | POA: Diagnosis not present

## 2023-05-24 DIAGNOSIS — J42 Unspecified chronic bronchitis: Secondary | ICD-10-CM | POA: Diagnosis not present

## 2023-06-06 DIAGNOSIS — J42 Unspecified chronic bronchitis: Secondary | ICD-10-CM | POA: Diagnosis not present

## 2023-06-06 DIAGNOSIS — F17210 Nicotine dependence, cigarettes, uncomplicated: Secondary | ICD-10-CM | POA: Diagnosis not present

## 2023-06-06 DIAGNOSIS — F209 Schizophrenia, unspecified: Secondary | ICD-10-CM | POA: Diagnosis not present

## 2023-06-06 DIAGNOSIS — G3184 Mild cognitive impairment, so stated: Secondary | ICD-10-CM | POA: Diagnosis not present

## 2023-07-07 DIAGNOSIS — F209 Schizophrenia, unspecified: Secondary | ICD-10-CM | POA: Diagnosis not present

## 2023-07-07 DIAGNOSIS — J42 Unspecified chronic bronchitis: Secondary | ICD-10-CM | POA: Diagnosis not present

## 2023-08-07 DIAGNOSIS — F209 Schizophrenia, unspecified: Secondary | ICD-10-CM | POA: Diagnosis not present

## 2023-08-07 DIAGNOSIS — J42 Unspecified chronic bronchitis: Secondary | ICD-10-CM | POA: Diagnosis not present

## 2023-08-13 ENCOUNTER — Encounter: Payer: Self-pay | Admitting: Podiatry

## 2023-08-13 ENCOUNTER — Ambulatory Visit (INDEPENDENT_AMBULATORY_CARE_PROVIDER_SITE_OTHER): Payer: Medicare Other | Admitting: Podiatry

## 2023-08-13 DIAGNOSIS — M79674 Pain in right toe(s): Secondary | ICD-10-CM | POA: Diagnosis not present

## 2023-08-13 DIAGNOSIS — B351 Tinea unguium: Secondary | ICD-10-CM | POA: Diagnosis not present

## 2023-08-13 DIAGNOSIS — M79675 Pain in left toe(s): Secondary | ICD-10-CM | POA: Diagnosis not present

## 2023-08-13 NOTE — Progress Notes (Signed)
This patient returns to the office for evaluation and treatment of long thick painful nails .  This patient is unable to trim his own nails since the patient cannot reach his feet.  Patient says the nails are painful walking and wearing his shoes. He is brought to the office by caregiver at his home.  He returns for preventive foot care services.  General Appearance  Alert, conversant and in no acute stress.  Vascular  Dorsalis pedis and posterior tibial  pulses are palpable  bilaterally.  Capillary return is within normal limits  bilaterally. Temperature is within normal limits  bilaterally.  Neurologic  Senn-Weinstein monofilament wire test within normal limits  bilaterally. Muscle power within normal limits bilaterally.  Nails Thick disfigured discolored nails with subungual debris  from hallux to fifth toes bilaterally. No evidence of bacterial infection or drainage bilaterally.  Orthopedic  No limitations of motion  feet .  No crepitus or effusions noted.  No bony pathology or digital deformities noted.  HAV  B/L.  Skin  normotropic skin with no porokeratosis noted bilaterally.  No signs of infections or ulcers noted.     Onychomycosis  Pain in toes right foot  Pain in toes left foot  Debridement  of nails  1-5  B/L with a nail nipper.  Nails were then filed using a dremel tool with no incidents.    RTC  4  months   Helane Gunther DPM

## 2023-08-20 DIAGNOSIS — Z0001 Encounter for general adult medical examination with abnormal findings: Secondary | ICD-10-CM | POA: Diagnosis not present

## 2023-08-20 DIAGNOSIS — B182 Chronic viral hepatitis C: Secondary | ICD-10-CM | POA: Diagnosis not present

## 2023-08-20 DIAGNOSIS — Z125 Encounter for screening for malignant neoplasm of prostate: Secondary | ICD-10-CM | POA: Diagnosis not present

## 2023-09-10 DIAGNOSIS — B182 Chronic viral hepatitis C: Secondary | ICD-10-CM | POA: Diagnosis not present

## 2023-09-10 DIAGNOSIS — G3184 Mild cognitive impairment, so stated: Secondary | ICD-10-CM | POA: Diagnosis not present

## 2023-09-10 DIAGNOSIS — J42 Unspecified chronic bronchitis: Secondary | ICD-10-CM | POA: Diagnosis not present

## 2023-09-10 DIAGNOSIS — F209 Schizophrenia, unspecified: Secondary | ICD-10-CM | POA: Diagnosis not present

## 2023-09-10 DIAGNOSIS — F17200 Nicotine dependence, unspecified, uncomplicated: Secondary | ICD-10-CM | POA: Diagnosis not present

## 2023-09-10 DIAGNOSIS — F17210 Nicotine dependence, cigarettes, uncomplicated: Secondary | ICD-10-CM | POA: Diagnosis not present

## 2023-09-10 DIAGNOSIS — Z1331 Encounter for screening for depression: Secondary | ICD-10-CM | POA: Diagnosis not present

## 2023-09-10 DIAGNOSIS — Z1389 Encounter for screening for other disorder: Secondary | ICD-10-CM | POA: Diagnosis not present

## 2023-09-10 DIAGNOSIS — Z0001 Encounter for general adult medical examination with abnormal findings: Secondary | ICD-10-CM | POA: Diagnosis not present

## 2023-09-10 DIAGNOSIS — Z23 Encounter for immunization: Secondary | ICD-10-CM | POA: Diagnosis not present

## 2023-10-17 DIAGNOSIS — F209 Schizophrenia, unspecified: Secondary | ICD-10-CM | POA: Diagnosis not present

## 2023-10-17 DIAGNOSIS — J42 Unspecified chronic bronchitis: Secondary | ICD-10-CM | POA: Diagnosis not present

## 2023-10-21 DIAGNOSIS — Z23 Encounter for immunization: Secondary | ICD-10-CM | POA: Diagnosis not present

## 2023-11-16 DIAGNOSIS — J42 Unspecified chronic bronchitis: Secondary | ICD-10-CM | POA: Diagnosis not present

## 2023-11-16 DIAGNOSIS — F209 Schizophrenia, unspecified: Secondary | ICD-10-CM | POA: Diagnosis not present

## 2023-12-08 ENCOUNTER — Ambulatory Visit (INDEPENDENT_AMBULATORY_CARE_PROVIDER_SITE_OTHER): Payer: Medicare Other | Admitting: Podiatry

## 2023-12-08 ENCOUNTER — Encounter: Payer: Self-pay | Admitting: Podiatry

## 2023-12-08 DIAGNOSIS — M79675 Pain in left toe(s): Secondary | ICD-10-CM | POA: Diagnosis not present

## 2023-12-08 DIAGNOSIS — B351 Tinea unguium: Secondary | ICD-10-CM

## 2023-12-08 DIAGNOSIS — Q828 Other specified congenital malformations of skin: Secondary | ICD-10-CM | POA: Diagnosis not present

## 2023-12-08 DIAGNOSIS — M79674 Pain in right toe(s): Secondary | ICD-10-CM

## 2023-12-08 NOTE — Progress Notes (Signed)
This patient returns to the office for evaluation and treatment of long thick painful nails .  This patient is unable to trim his own nails since the patient cannot reach his feet.  Patient says the nails are painful walking and wearing his shoes. He is brought to the office by caregiver at his home.  He returns for preventive foot care services.  General Appearance  Alert, conversant and in no acute stress.  Vascular  Dorsalis pedis and posterior tibial  pulses are palpable  bilaterally.  Capillary return is within normal limits  bilaterally. Temperature is within normal limits  bilaterally.  Neurologic  Senn-Weinstein monofilament wire test within normal limits  bilaterally. Muscle power within normal limits bilaterally.  Nails Thick disfigured discolored nails with subungual debris  from hallux to fifth toes bilaterally. No evidence of bacterial infection or drainage bilaterally.  Orthopedic  No limitations of motion  feet .  No crepitus or effusions noted.  No bony pathology or digital deformities noted.  HAV  B/L.  Skin  normotropic skin with no porokeratosis noted bilaterally.  No signs of infections or ulcers noted.   Porokeratosis sub 5th met left foot.  Porokeratosis sub 5th metabase right foot.  Onychomycosis  Pain in toes right foot  Pain in toes left foot  Debride callus  B/L.  Debridement  of nails  1-5  B/L with a nail nipper.  Nails were then filed using a dremel tool with no incidents.    RTC  6 months   Helane Gunther DPM

## 2023-12-17 DIAGNOSIS — J42 Unspecified chronic bronchitis: Secondary | ICD-10-CM | POA: Diagnosis not present

## 2023-12-17 DIAGNOSIS — F209 Schizophrenia, unspecified: Secondary | ICD-10-CM | POA: Diagnosis not present

## 2024-01-16 DIAGNOSIS — F209 Schizophrenia, unspecified: Secondary | ICD-10-CM | POA: Diagnosis not present

## 2024-01-16 DIAGNOSIS — J42 Unspecified chronic bronchitis: Secondary | ICD-10-CM | POA: Diagnosis not present

## 2024-01-26 DIAGNOSIS — Z0001 Encounter for general adult medical examination with abnormal findings: Secondary | ICD-10-CM | POA: Diagnosis not present

## 2024-01-26 DIAGNOSIS — J42 Unspecified chronic bronchitis: Secondary | ICD-10-CM | POA: Diagnosis not present

## 2024-01-26 DIAGNOSIS — F209 Schizophrenia, unspecified: Secondary | ICD-10-CM | POA: Diagnosis not present

## 2024-01-26 DIAGNOSIS — B182 Chronic viral hepatitis C: Secondary | ICD-10-CM | POA: Diagnosis not present

## 2024-01-27 DIAGNOSIS — F29 Unspecified psychosis not due to a substance or known physiological condition: Secondary | ICD-10-CM | POA: Diagnosis not present

## 2024-02-13 DIAGNOSIS — F209 Schizophrenia, unspecified: Secondary | ICD-10-CM | POA: Diagnosis not present

## 2024-02-13 DIAGNOSIS — J42 Unspecified chronic bronchitis: Secondary | ICD-10-CM | POA: Diagnosis not present

## 2024-03-17 DIAGNOSIS — F209 Schizophrenia, unspecified: Secondary | ICD-10-CM | POA: Diagnosis not present

## 2024-03-17 DIAGNOSIS — J42 Unspecified chronic bronchitis: Secondary | ICD-10-CM | POA: Diagnosis not present

## 2024-03-23 DIAGNOSIS — F17210 Nicotine dependence, cigarettes, uncomplicated: Secondary | ICD-10-CM | POA: Diagnosis not present

## 2024-03-23 DIAGNOSIS — F17200 Nicotine dependence, unspecified, uncomplicated: Secondary | ICD-10-CM | POA: Diagnosis not present

## 2024-03-23 DIAGNOSIS — F209 Schizophrenia, unspecified: Secondary | ICD-10-CM | POA: Diagnosis not present

## 2024-03-23 DIAGNOSIS — G3184 Mild cognitive impairment, so stated: Secondary | ICD-10-CM | POA: Diagnosis not present

## 2024-03-23 DIAGNOSIS — J42 Unspecified chronic bronchitis: Secondary | ICD-10-CM | POA: Diagnosis not present

## 2024-04-23 DIAGNOSIS — J42 Unspecified chronic bronchitis: Secondary | ICD-10-CM | POA: Diagnosis not present

## 2024-04-23 DIAGNOSIS — F209 Schizophrenia, unspecified: Secondary | ICD-10-CM | POA: Diagnosis not present

## 2024-05-23 DIAGNOSIS — J42 Unspecified chronic bronchitis: Secondary | ICD-10-CM | POA: Diagnosis not present

## 2024-05-23 DIAGNOSIS — F209 Schizophrenia, unspecified: Secondary | ICD-10-CM | POA: Diagnosis not present

## 2024-06-07 ENCOUNTER — Ambulatory Visit: Payer: Medicare Other | Admitting: Podiatry

## 2024-06-23 DIAGNOSIS — F209 Schizophrenia, unspecified: Secondary | ICD-10-CM | POA: Diagnosis not present

## 2024-06-23 DIAGNOSIS — J42 Unspecified chronic bronchitis: Secondary | ICD-10-CM | POA: Diagnosis not present

## 2024-07-08 DIAGNOSIS — F29 Unspecified psychosis not due to a substance or known physiological condition: Secondary | ICD-10-CM | POA: Diagnosis not present

## 2024-07-24 DIAGNOSIS — J42 Unspecified chronic bronchitis: Secondary | ICD-10-CM | POA: Diagnosis not present

## 2024-07-24 DIAGNOSIS — F209 Schizophrenia, unspecified: Secondary | ICD-10-CM | POA: Diagnosis not present

## 2024-08-09 DIAGNOSIS — Z23 Encounter for immunization: Secondary | ICD-10-CM | POA: Diagnosis not present

## 2024-08-23 DIAGNOSIS — J42 Unspecified chronic bronchitis: Secondary | ICD-10-CM | POA: Diagnosis not present

## 2024-08-23 DIAGNOSIS — F209 Schizophrenia, unspecified: Secondary | ICD-10-CM | POA: Diagnosis not present

## 2024-09-27 ENCOUNTER — Encounter (INDEPENDENT_AMBULATORY_CARE_PROVIDER_SITE_OTHER): Payer: Self-pay | Admitting: *Deleted

## 2024-09-27 DIAGNOSIS — G3184 Mild cognitive impairment, so stated: Secondary | ICD-10-CM | POA: Diagnosis not present

## 2024-09-27 DIAGNOSIS — F17200 Nicotine dependence, unspecified, uncomplicated: Secondary | ICD-10-CM | POA: Diagnosis not present

## 2024-09-27 DIAGNOSIS — F17210 Nicotine dependence, cigarettes, uncomplicated: Secondary | ICD-10-CM | POA: Diagnosis not present

## 2024-09-27 DIAGNOSIS — F209 Schizophrenia, unspecified: Secondary | ICD-10-CM | POA: Diagnosis not present

## 2024-09-27 DIAGNOSIS — B182 Chronic viral hepatitis C: Secondary | ICD-10-CM | POA: Diagnosis not present

## 2024-09-27 DIAGNOSIS — Z23 Encounter for immunization: Secondary | ICD-10-CM | POA: Diagnosis not present

## 2024-09-27 DIAGNOSIS — Z0001 Encounter for general adult medical examination with abnormal findings: Secondary | ICD-10-CM | POA: Diagnosis not present

## 2024-09-27 DIAGNOSIS — Z1389 Encounter for screening for other disorder: Secondary | ICD-10-CM | POA: Diagnosis not present

## 2024-09-27 DIAGNOSIS — J42 Unspecified chronic bronchitis: Secondary | ICD-10-CM | POA: Diagnosis not present

## 2024-09-27 DIAGNOSIS — Z1331 Encounter for screening for depression: Secondary | ICD-10-CM | POA: Diagnosis not present
